# Patient Record
Sex: Female | Born: 1985 | Race: Black or African American | Hispanic: No | Marital: Single | Smoking: Current some day smoker
Health system: Southern US, Community
[De-identification: ages and names within clinical notes are randomized; demographics above are authoritative.]

## PROBLEM LIST (undated history)

## (undated) DIAGNOSIS — F419 Anxiety disorder, unspecified: Secondary | ICD-10-CM

## (undated) DIAGNOSIS — G510 Bell's palsy: Secondary | ICD-10-CM

## (undated) DIAGNOSIS — T8859XA Other complications of anesthesia, initial encounter: Secondary | ICD-10-CM

## (undated) DIAGNOSIS — R51 Headache: Secondary | ICD-10-CM

## (undated) DIAGNOSIS — R519 Headache, unspecified: Secondary | ICD-10-CM

## (undated) DIAGNOSIS — T4145XA Adverse effect of unspecified anesthetic, initial encounter: Secondary | ICD-10-CM

## (undated) DIAGNOSIS — J45909 Unspecified asthma, uncomplicated: Secondary | ICD-10-CM

## (undated) HISTORY — PX: INDUCED ABORTION: SHX677

## (undated) SURGERY — Surgical Case
Anesthesia: *Unknown

---

## 2008-05-09 ENCOUNTER — Emergency Department: Payer: Self-pay | Admitting: Emergency Medicine

## 2008-05-18 DIAGNOSIS — E669 Obesity, unspecified: Secondary | ICD-10-CM | POA: Insufficient documentation

## 2008-10-02 ENCOUNTER — Emergency Department: Payer: Self-pay | Admitting: Emergency Medicine

## 2009-02-09 ENCOUNTER — Inpatient Hospital Stay: Payer: Self-pay

## 2011-11-04 ENCOUNTER — Emergency Department: Payer: Self-pay | Admitting: Emergency Medicine

## 2011-12-09 ENCOUNTER — Emergency Department: Payer: Self-pay | Admitting: Emergency Medicine

## 2011-12-09 LAB — PREGNANCY, URINE: Pregnancy Test, Urine: NEGATIVE m[IU]/mL
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
## 2016-04-15 ENCOUNTER — Emergency Department
Admission: EM | Admit: 2016-04-15 | Discharge: 2016-04-15 | Disposition: A | Discharge status: Home / Self Care | Payer: Self-pay | Attending: Emergency Medicine | Admitting: Emergency Medicine

## 2016-04-15 DIAGNOSIS — H1033 Unspecified acute conjunctivitis, bilateral: Secondary | ICD-10-CM | POA: Insufficient documentation

## 2016-04-15 DIAGNOSIS — H10023 Other mucopurulent conjunctivitis, bilateral: Secondary | ICD-10-CM

## 2016-04-15 MED ORDER — TOBRAMYCIN 0.3 % OP SOLN: 1.0000 [drp] | Freq: Three times a day (TID) | OPHTHALMIC | 0 refills | Status: AC

## 2016-04-15 NOTE — ED Triage Notes (Signed)
Pt reports pink eye, drainage in both eyes

## 2016-04-15 NOTE — ED Provider Notes (Signed)
[facility]-EMERGENCY DEPARTMENT Provider Note   CSN: [id number] Arrival date & time: 04/15/16  1256     History   Chief Complaint Chief Complaint  Patient presents with  . Conjunctivitis    HPI Miranda Hoffman is a 30 y.o. female.  30 year old female presents today complaining of bilateral eye irritation and swelling for the past 2-3 days. Pt concerned she may have pink eye, no known sick contacts. Mild drainage from the eyes. Has not had any vision changes or eye pain. Does not wear contact lenses or glasses    The history is provided by the patient.  Conjunctivitis  This is a new problem. The current episode started 2 days ago. The problem occurs constantly. The problem has not changed since onset.Nothing aggravates the symptoms. Nothing relieves the symptoms. She has tried nothing for the symptoms.    History reviewed. No pertinent past medical history.  There are no active problems to display for this patient.   History reviewed. No pertinent surgical history.  OB History    No data available       Home Medications    Prior to Admission medications   Medication Sig Start Date End Date Taking? Authorizing Provider  tobramycin (TOBREX) 0.3 % ophthalmic solution Place 1 drop into the right eye 3 (three) times daily. 04/15/16 04/25/16  Christella ScheuermannEmma V Eleazar Kimmey, PA-C    Family History No family history on file.  Social History Social History  Substance Use Topics  . Smoking status: Never Smoker  . Smokeless tobacco: Never Used  . Alcohol use Yes     Allergies   Review of patient's allergies indicates no known allergies.   Review of Systems Review of Systems  Constitutional: Negative for chills and fever.  HENT: Negative for rhinorrhea.   Eyes: Positive for discharge, redness and itching. Negative for photophobia, pain and visual disturbance.  All other systems reviewed and are negative.    Physical Exam Updated Vital Signs BP 126/72   Pulse 77   Temp 98.2  F (36.8 C)   Resp 16   Ht 5\' 7"  (1.702 m)   Wt 103 kg   LMP 03/24/2016   SpO2 100%   BMI 35.55 kg/m   Physical Exam  Constitutional: She appears well-developed and well-nourished.  HENT:  Head: Normocephalic and atraumatic.  Right Ear: External ear normal.  Left Ear: External ear normal.  Nose: Nose normal.  Mouth/Throat: Oropharynx is clear and moist.  Eyes: EOM are normal. Pupils are equal, round, and reactive to light. Right eye exhibits discharge. Left eye exhibits discharge.  Neck: Normal range of motion. Neck supple.  Lymphadenopathy:    She has no cervical adenopathy.  Skin: Skin is warm and dry.  Nursing note and vitals reviewed.    ED Treatments / Results  Labs (all labs ordered are listed, but only abnormal results are displayed) Labs Reviewed - No data to display  EKG  EKG Interpretation None       Radiology No results found.  Procedures Procedures (including critical care time)  Medications Ordered in ED Medications - No data to display   Initial Impression / Assessment and Plan / ED Course  I have reviewed the triage vital signs and the nursing notes.  Pertinent labs & imaging results that were available during my care of the patient were reviewed by me and considered in my medical decision making (see chart for details).  Clinical Course      Final Clinical Impressions(s) /  ED Diagnoses   Final diagnoses:  Acute bacterial conjunctivitis, bilateral    New Prescriptions Discharge Medication List as of 04/15/2016  2:06 PM    START taking these medications   Details  tobramycin (TOBREX) 0.3 % ophthalmic solution Place 1 drop into the right eye 3 (three) times daily., Starting Tue 04/15/2016, Until Fri 04/25/2016, Print         Christella ScheuermannEmma V Eliese Kerwood, PA-C 04/15/16 1423    Emily FilbertJonathan E Williams, MD 04/15/16 [phone]

## 2016-06-19 ENCOUNTER — Encounter: Payer: Self-pay | Admitting: Emergency Medicine

## 2016-06-19 ENCOUNTER — Emergency Department
Admission: EM | Admit: 2016-06-19 | Discharge: 2016-06-19 | Disposition: A | Discharge status: Home / Self Care | Payer: No Typology Code available for payment source | Attending: Emergency Medicine | Admitting: Emergency Medicine

## 2016-06-19 ENCOUNTER — Emergency Department: Payer: No Typology Code available for payment source

## 2016-06-19 DIAGNOSIS — Y92410 Unspecified street and highway as the place of occurrence of the external cause: Secondary | ICD-10-CM | POA: Insufficient documentation

## 2016-06-19 DIAGNOSIS — Y9389 Activity, other specified: Secondary | ICD-10-CM | POA: Insufficient documentation

## 2016-06-19 DIAGNOSIS — Y999 Unspecified external cause status: Secondary | ICD-10-CM | POA: Insufficient documentation

## 2016-06-19 DIAGNOSIS — S80211A Abrasion, right knee, initial encounter: Secondary | ICD-10-CM

## 2016-06-19 DIAGNOSIS — S8391XA Sprain of unspecified site of right knee, initial encounter: Secondary | ICD-10-CM

## 2016-06-19 DIAGNOSIS — S8991XA Unspecified injury of right lower leg, initial encounter: Secondary | ICD-10-CM | POA: Diagnosis present

## 2016-06-19 MED ORDER — NAPROXEN 500 MG PO TBEC: 500.0000 mg | DELAYED_RELEASE_TABLET | Freq: Two times a day (BID) | ORAL | 0 refills | Status: DC
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
## 2016-06-19 NOTE — Discharge Instructions (Signed)
Keep the ace bandage in place for support as needed. Apply ice to reduce swelling. Take the Naproxen as needed for pain relief. Follow-up with Dr. Ernest PineHooten for continued symptoms.
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
## 2016-08-05 ENCOUNTER — Encounter: Payer: Self-pay | Admitting: Emergency Medicine

## 2016-08-05 ENCOUNTER — Emergency Department
Admission: EM | Admit: 2016-08-05 | Discharge: 2016-08-05 | Disposition: A | Discharge status: Home / Self Care | Payer: Medicaid Other | Attending: Emergency Medicine | Admitting: Emergency Medicine

## 2016-08-05 DIAGNOSIS — F321 Major depressive disorder, single episode, moderate: Secondary | ICD-10-CM

## 2016-08-05 DIAGNOSIS — F320 Major depressive disorder, single episode, mild: Secondary | ICD-10-CM | POA: Insufficient documentation

## 2016-08-05 DIAGNOSIS — Z79899 Other long term (current) drug therapy: Secondary | ICD-10-CM | POA: Insufficient documentation

## 2016-08-05 HISTORY — DX: Bell's palsy: G51.0

## 2016-08-05 LAB — COMPREHENSIVE METABOLIC PANEL
ALT: 15 U/L (ref 14–54)
ANION GAP: 8 (ref 5–15)
AST: 23 U/L (ref 15–41)
Albumin: 3.9 g/dL (ref 3.5–5.0)
Alkaline Phosphatase: 81 U/L (ref 38–126)
BUN: 11 mg/dL (ref 6–20)
CHLORIDE: 105 mmol/L (ref 101–111)
CO2: 23 mmol/L (ref 22–32)
CREATININE: 0.71 mg/dL (ref 0.44–1.00)
Calcium: 9.4 mg/dL (ref 8.9–10.3)
Glucose, Bld: 91 mg/dL (ref 65–99)
Potassium: 3.7 mmol/L (ref 3.5–5.1)
Sodium: 136 mmol/L (ref 135–145)
Total Bilirubin: 0.7 mg/dL (ref 0.3–1.2)
Total Protein: 7.9 g/dL (ref 6.5–8.1)

## 2016-08-05 LAB — CBC
HCT: 37.2 % (ref 35.0–47.0)
Hemoglobin: 12.4 g/dL (ref 12.0–16.0)
MCH: 28.1 pg (ref 26.0–34.0)
MCHC: 33.4 g/dL (ref 32.0–36.0)
MCV: 84.2 fL (ref 80.0–100.0)
PLATELETS: 320 10*3/uL (ref 150–440)
RBC: 4.42 MIL/uL (ref 3.80–5.20)
RDW: 13.7 % (ref 11.5–14.5)
WBC: 4.1 10*3/uL (ref 3.6–11.0)

## 2016-08-05 LAB — URINE DRUG SCREEN, QUALITATIVE (ARMC ONLY)
Amphetamines, Ur Screen: NOT DETECTED
BARBITURATES, UR SCREEN: NOT DETECTED
Benzodiazepine, Ur Scrn: NOT DETECTED
CANNABINOID 50 NG, UR ~~LOC~~: NOT DETECTED
Cocaine Metabolite,Ur ~~LOC~~: NOT DETECTED
MDMA (ECSTASY) UR SCREEN: NOT DETECTED
Methadone Scn, Ur: NOT DETECTED
Opiate, Ur Screen: NOT DETECTED
Phencyclidine (PCP) Ur S: NOT DETECTED
TRICYCLIC, UR SCREEN: NOT DETECTED

## 2016-08-05 LAB — SALICYLATE LEVEL

## 2016-08-05 LAB — ACETAMINOPHEN LEVEL: Acetaminophen (Tylenol), Serum: <10 ug/mL — ABNORMAL LOW (ref 10–30)

## 2016-08-05 LAB — ETHANOL

## 2016-08-05 LAB — POCT PREGNANCY, URINE: PREG TEST UR: NEGATIVE

## 2016-08-05 MED ORDER — FLUOXETINE HCL 20 MG PO CAPS: 20.0000 mg | ORAL_CAPSULE | Freq: Every day | ORAL | 1 refills | Status: DC

## 2016-08-05 NOTE — ED Notes (Signed)
BEHAVIORAL HEALTH ROUNDING Patient sleeping: No. Patient alert and oriented: yes Behavior appropriate: Yes.  ; If no, describe:  Nutrition and fluids offered: yes Toileting and hygiene offered: Yes  Sitter present: q15 minute observations and security  monitoring Law enforcement present: Yes  ODS   She has a visitor - her mother - observed by pt relations Alfredia ClientMary Jo

## 2016-08-05 NOTE — ED Notes (Signed)
BEHAVIORAL HEALTH ROUNDING Patient sleeping: No. Patient alert and oriented: yes Behavior appropriate: Yes.  ; If no, describe:  Nutrition and fluids offered: yes Toileting and hygiene offered: Yes  Sitter present: q15 minute observations and security  monitoring Law enforcement present: Yes  ODS  She is upset that we do not have a toilet seat on the commode in the quad BR - she is insisting on going to the BR down the hallway  Pt informed that we do not have the staff to escort her to the BR every time when a BR for her use is steps from her bed  Pt educated about safety and why we have procedures in place
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
## 2016-08-05 NOTE — ED Provider Notes (Signed)
[facility] Emergency Department Provider Note   ____________________________________________   First MD Initiated Contact with Patient 08/05/16 0901     (approximate)  I have reviewed the triage vital signs and the nursing notes.   HISTORY  Chief Complaint Depression    HPI Margaretha SeedsJuakisha T Bjorkman is a 30 y.o. female the patient having multiple stressors recently which are documented in the nurse's notes. Patient feels very depressed. Patient does not have anyone to talk to. Patient was thinking of suicide but has noted says she would never do it.   Past Medical History:  Diagnosis Date  . Bell's palsy     There are no active problems to display for this patient.   Past Surgical History:  Procedure Laterality Date  . INDUCED ABORTION      Prior to Admission medications   Medication Sig Start Date End Date Taking? Authorizing Provider  naproxen (EC NAPROSYN) 500 MG EC tablet Take 1 tablet (500 mg total) by mouth 2 (two) times daily with a meal. 06/19/16   Jenise V Bacon Menshew, PA-C    Allergies Patient has no known allergies.  No family history on file.  Social History Social History  Substance Use Topics  . Smoking status: Never Smoker  . Smokeless tobacco: Never Used  . Alcohol use Yes    Review of Systems Constitutional: No fever/chills Eyes: No visual changes. ENT: No sore throat. Cardiovascular: Denies chest pain. Respiratory: Denies shortness of breath. Gastrointestinal: No abdominal pain.  No nausea, no vomiting.  No diarrhea.  No constipation. Genitourinary: Negative for dysuria. Musculoskeletal: Negative for back pain. Skin: Negative for rash. Neurological: Negative for headaches, focal weakness or numbness.  10-point ROS otherwise negative.  ____________________________________________   PHYSICAL EXAM:  VITAL SIGNS: ED Triage Vitals  Enc Vitals Group     BP 08/05/16 0839 140/84     Pulse Rate 08/05/16 0839  71     Resp 08/05/16 0839 18     Temp 08/05/16 0839 98.5 F (36.9 C)     Temp Source 08/05/16 0839 Oral     SpO2 08/05/16 0839 97 %     Weight 08/05/16 0840 220 lb (99.8 kg)     Height 08/05/16 0840 5\' 7"  (1.702 m)     Head Circumference --      Peak Flow --      Pain Score 08/05/16 0842 5     Pain Loc --      Pain Edu? --      Excl. in GC? --     Constitutional: Alert and oriented. Well appearing and in no acute distress. Eyes: Conjunctivae are normal. PERRL. EOMI. Head: Atraumatic. Nose: No congestion/rhinnorhea. Mouth/Throat: Mucous membranes are moist.  Oropharynx non-erythematous. Neck: No stridor Cardiovascular: Normal rate, regular rhythm. Grossly normal heart sounds.  Good peripheral circulation. Respiratory: Normal respiratory effort.  No retractions. Lungs CTAB. Gastrointestinal: Soft and nontender. No distention. No abdominal bruits. No CVA tenderness. Musculoskeletal: No lower extremity tenderness nor edema.  No joint effusions.   ____________________________________________   LABS (all labs ordered are listed, but only abnormal results are displayed)  Labs Reviewed  ACETAMINOPHEN LEVEL - Abnormal; Notable for the following:       Result Value   Acetaminophen (Tylenol), Serum <10 (*)    All other components within normal limits  COMPREHENSIVE METABOLIC PANEL  ETHANOL  SALICYLATE LEVEL  CBC  URINE DRUG SCREEN, QUALITATIVE ([facility] ONLY)  POC URINE PREG, ED  POCT PREGNANCY, URINE  ____________________________________________  EKG   ____________________________________________  RADIOLOGY   ____________________________________________   PROCEDURES  Procedure(s) performed:  Procedures  Critical Care performed:   ____________________________________________   INITIAL IMPRESSION / ASSESSMENT AND PLAN / ED COURSE  Pertinent labs & imaging results that were available during my care of the patient were reviewed by me and considered in my medical  decision making (see chart for details).       Clinical Course      ____________________________________________   FINAL CLINICAL IMPRESSION(S) / ED DIAGNOSES  Final diagnoses:  Mild single current episode of major depressive disorder (HCC)      NEW MEDICATIONS STARTED DURING THIS VISIT:  New Prescriptions   No medications on file     Note:  This document was prepared using Dragon voice recognition software and may include unintentional dictation errors.    Arnaldo NatalPaul F Brennyn Ortlieb, MD 08/05/16 [phone]

## 2016-08-05 NOTE — ED Notes (Signed)
MD Clapacs consulting at this time   

## 2016-08-05 NOTE — Consult Note (Signed)
[facility] Consult   Reason for Consult:  Consult for 30 year old woman who presents voluntarily with symptoms of depression Referring Physician:  Cinda Quest Patient Identification: Miranda Hoffman MRN:  [id number] Principal Diagnosis: Depression, major, single episode, moderate ([place]) Diagnosis:   Patient Active Problem List   Diagnosis Date Noted  . Depression, major, single episode, moderate ([place]) [F32.1] 08/05/2016    Total Time spent with patient: 1 hour  Subjective:   Miranda Hoffman is a 30 y.o. female patient admitted with "stress has just been building up and I don't have much of an outlet".  HPI:  Patient interviewed. Chart reviewed. Labs reviewed. 30 year old woman presented voluntarily at the request of her mother. Patient reports she's been having problems with depression for a couple months. Her mood has been persistently down. Energy level has been very low. She rarely gets up out of bed. Not taking care of basic things around the house. Patient lost her job a month or so ago and then lost her vehicle. Not currently working. Major financial problems. Tiredness a lot during the day. Sleeps poorly at night with frequent awakening. Appetite poor. Has had no active suicidal thoughts or wish to die. Does not have any psychotic symptoms. Today she said that she kind of was at her wits end and had a little bit of a freak out. She was throwing some objects around her own bedroom. Didn't hurt anyone. No sign that there was any acute dangerousness really. She is not receiving any outpatient psychiatric treatment.  Medical history: Patient has a Bell's palsy that apparently happened during her last pregnancy. It's on the right side and is very minor. No other medical problems.  Social history: She is a single mother of twin young children. She has her mother living with her as well. She feels like other extended family members are not supportive.  Substance abuse  history: Says she drinks very rarely. No alcohol in the system. Denies any drug abuse current or past.  Past Psychiatric History: Patient has had some bad mood problems in the past but has never been in a psychiatric hospital never been on any psychiatric medicine. No other mental health or psychiatric treatment. No history of suicide attempts.  Risk to Self: Is patient at risk for suicide?: No Risk to Others:   Prior Inpatient Therapy:   Prior Outpatient Therapy:    Past Medical History:  Past Medical History:  Diagnosis Date  . Bell's palsy     Past Surgical History:  Procedure Laterality Date  . INDUCED ABORTION     Family History: No family history on file. Family Psychiatric  History: She thinks she had an aunt who had depression Social History:  History  Alcohol Use  . Yes     History  Drug use: Unknown    Social History   Social History  . Marital status: Single    Spouse name: N/A  . Number of children: N/A  . Years of education: N/A   Social History Main Topics  . Smoking status: Never Smoker  . Smokeless tobacco: Never Used  . Alcohol use Yes  . Drug use: Unknown  . Sexual activity: Not Asked   Other Topics Concern  . None   Social History Narrative  . None   Additional Social History:    Allergies:  No Known Allergies  Labs:  Results for orders placed or performed during the hospital encounter of 08/05/16 (from the past 48 hour(s))  Urine Drug  Screen, Qualitative     Status: None   Collection Time: 08/05/16  8:19 AM  Result Value Ref Range   Tricyclic, Ur Screen NONE DETECTED NONE DETECTED   Amphetamines, Ur Screen NONE DETECTED NONE DETECTED   MDMA (Ecstasy)Ur Screen NONE DETECTED NONE DETECTED   Cocaine Metabolite,Ur [place] NONE DETECTED NONE DETECTED   Opiate, Ur Screen NONE DETECTED NONE DETECTED   Phencyclidine (PCP) Ur S NONE DETECTED NONE DETECTED   Cannabinoid 50 Ng, Ur [place] NONE DETECTED NONE DETECTED   Barbiturates, Ur Screen NONE  DETECTED NONE DETECTED   Benzodiazepine, Ur Scrn NONE DETECTED NONE DETECTED   Methadone Scn, Ur NONE DETECTED NONE DETECTED    Comment: (NOTE) 270  Tricyclics, urine               Cutoff 1000 ng/mL 200  Amphetamines, urine             Cutoff 1000 ng/mL 300  MDMA (Ecstasy), urine           Cutoff 500 ng/mL 400  Cocaine Metabolite, urine       Cutoff 300 ng/mL 500  Opiate, urine                   Cutoff 300 ng/mL 600  Phencyclidine (PCP), urine      Cutoff 25 ng/mL 700  Cannabinoid, urine              Cutoff 50 ng/mL 800  Barbiturates, urine             Cutoff 200 ng/mL 900  Benzodiazepine, urine           Cutoff 200 ng/mL 1000 Methadone, urine                Cutoff 300 ng/mL 1100 1200 The urine drug screen provides only a preliminary, unconfirmed 1300 analytical test result and should not be used for non-medical 1400 purposes. Clinical consideration and professional judgment should 1500 be applied to any positive drug screen result due to possible 1600 interfering substances. A more specific alternate chemical method 1700 must be used in order to obtain a confirmed analytical result.  1800 Gas chromato graphy / mass spectrometry (GC/MS) is the preferred 1900 confirmatory method.   Comprehensive metabolic panel     Status: None   Collection Time: 08/05/16  8:47 AM  Result Value Ref Range   Sodium 136 135 - 145 mmol/L   Potassium 3.7 3.5 - 5.1 mmol/L   Chloride 105 101 - 111 mmol/L   CO2 23 22 - 32 mmol/L   Glucose, Bld 91 65 - 99 mg/dL   BUN 11 6 - 20 mg/dL   Creatinine, Ser 0.71 0.44 - 1.00 mg/dL   Calcium 9.4 8.9 - 10.3 mg/dL   Total Protein 7.9 6.5 - 8.1 g/dL   Albumin 3.9 3.5 - 5.0 g/dL   AST 23 15 - 41 U/L   ALT 15 14 - 54 U/L   Alkaline Phosphatase 81 38 - 126 U/L   Total Bilirubin 0.7 0.3 - 1.2 mg/dL   GFR calc non Af Amer >60 >60 mL/min   GFR calc Af Amer >60 >60 mL/min    Comment: (NOTE) The eGFR has been calculated using the CKD EPI equation. This calculation  has not been validated in all clinical situations. eGFR's persistently <60 mL/min signify possible Chronic Kidney Disease.    Anion gap 8 5 - 15  Ethanol     Status: None   Collection  Time: 08/05/16  8:47 AM  Result Value Ref Range   Alcohol, Ethyl (B) <5 <5 mg/dL    Comment:        LOWEST DETECTABLE LIMIT FOR SERUM ALCOHOL IS 5 mg/dL FOR MEDICAL PURPOSES ONLY   Salicylate level     Status: None   Collection Time: 08/05/16  8:47 AM  Result Value Ref Range   Salicylate Lvl <1.7 2.8 - 30.0 mg/dL  Acetaminophen level     Status: Abnormal   Collection Time: 08/05/16  8:47 AM  Result Value Ref Range   Acetaminophen (Tylenol), Serum <10 (L) 10 - 30 ug/mL    Comment:        THERAPEUTIC CONCENTRATIONS VARY SIGNIFICANTLY. A RANGE OF 10-30 ug/mL MAY BE AN EFFECTIVE CONCENTRATION FOR MANY PATIENTS. HOWEVER, SOME ARE BEST TREATED AT CONCENTRATIONS OUTSIDE THIS RANGE. ACETAMINOPHEN CONCENTRATIONS >150 ug/mL AT 4 HOURS AFTER INGESTION AND >50 ug/mL AT 12 HOURS AFTER INGESTION ARE OFTEN ASSOCIATED WITH TOXIC REACTIONS.   cbc     Status: None   Collection Time: 08/05/16  8:47 AM  Result Value Ref Range   WBC 4.1 3.6 - 11.0 K/uL   RBC 4.42 3.80 - 5.20 MIL/uL   Hemoglobin 12.4 12.0 - 16.0 g/dL   HCT 37.2 35.0 - 47.0 %   MCV 84.2 80.0 - 100.0 fL   MCH 28.1 26.0 - 34.0 pg   MCHC 33.4 32.0 - 36.0 g/dL   RDW 13.7 11.5 - 14.5 %   Platelets 320 150 - 440 K/uL  Pregnancy, urine POC     Status: None   Collection Time: 08/05/16  9:26 AM  Result Value Ref Range   Preg Test, Ur NEGATIVE NEGATIVE    Comment:        THE SENSITIVITY OF THIS METHODOLOGY IS >24 mIU/mL     No current facility-administered medications for this encounter.    Current Outpatient Prescriptions  Medication Sig Dispense Refill  . FLUoxetine (PROZAC) 20 MG capsule Take 1 capsule (20 mg total) by mouth daily. 30 capsule 1  . naproxen (EC NAPROSYN) 500 MG EC tablet Take 1 tablet (500 mg total) by mouth 2 (two)  times daily with a meal. 30 tablet 0    Musculoskeletal: Strength & Muscle Tone: within normal limits Gait & Station: normal Patient leans: N/A  Psychiatric Specialty Exam: Physical Exam  Nursing note and vitals reviewed. Constitutional: She appears well-developed and well-nourished.  HENT:  Head: Normocephalic and atraumatic.  Eyes: Conjunctivae are normal. Pupils are equal, round, and reactive to light.  Neck: Normal range of motion.  Cardiovascular: Regular rhythm and normal heart sounds.   Respiratory: Effort normal. No respiratory distress.  GI: Soft.  Musculoskeletal: Normal range of motion.  Neurological: She is alert.  Skin: Skin is warm and dry.  Psychiatric: Judgment normal. Her speech is delayed. She is slowed. Thought content is not paranoid. Cognition and memory are normal. She exhibits a depressed mood. She expresses no homicidal and no suicidal ideation.    Review of Systems  Constitutional: Negative.   HENT: Negative.   Eyes: Negative.   Respiratory: Negative.   Cardiovascular: Negative.   Gastrointestinal: Negative.   Musculoskeletal: Negative.   Skin: Negative.   Neurological: Negative.   Psychiatric/Behavioral: Positive for depression. Negative for hallucinations, memory loss, substance abuse and suicidal ideas. The patient is nervous/anxious and has insomnia.     Blood pressure 116/68, pulse 88, temperature 98.3 F (36.8 C), temperature source Oral, resp. rate 18, height '5\' 7"'$  (  1.702 m), weight 99.8 kg (220 lb), last menstrual period 07/28/2016, SpO2 97 %.Body mass index is 34.46 kg/m.  General Appearance: Casual  Eye Contact:  Good  Speech:  Slow  Volume:  Decreased  Mood:  Depressed  Affect:  Congruent  Thought Process:  Goal Directed  Orientation:  Full (Time, Place, and Person)  Thought Content:  Logical  Suicidal Thoughts:  No  Homicidal Thoughts:  No  Memory:  Immediate;   Good Recent;   Fair Remote;   Fair  Judgement:  Fair  Insight:   Fair  Psychomotor Activity:  Decreased  Concentration:  Concentration: Fair  Recall:  [employer] of Knowledge:  Fair  Language:  Fair  Akathisia:  No  Handed:  Right  AIMS (if indicated):     Assets:  Communication Skills Desire for Improvement Financial Resources/Insurance Housing Physical Health Resilience Social Support  ADL's:  Intact  Cognition:  WNL  Sleep:        Treatment Plan Summary: Medication management and Plan This is a 30 year old woman and give symptoms consistent with major depression. No psychosis. No acute suicidality. No history of suicidal behavior. Patient is interested in treatment and is appropriate in her behavior. No need for psychiatric hospitalization. Psychoeducation about depression provided. I recommend that we start an antidepressant medicine. Side effects and use of antidepressants described. Prescription done for Prozac 20 mg per day with a refill. She will be referred to [facility]. Case reviewed with TTS and emergency room physician. She can be discharged from the emergency room.  Disposition: Patient does not meet criteria for psychiatric inpatient admission. Supportive therapy provided about ongoing stressors.  Alethia Berthold, MD 08/05/2016 3:49 PM

## 2016-08-05 NOTE — ED Notes (Signed)

## 2016-08-05 NOTE — BH Assessment (Signed)
Per request of Psych MD (Dr. Blair Haileylapcs), writer provided the pt. with information and instructions on how to access Outpatient Mental Health & Substance Abuse Treatment ([facility]) .  Patient denies SI/HI and AV/H.

## 2016-08-05 NOTE — BH Assessment (Signed)
Assessment Note  Miranda Hoffman is an 30 y.o. female who presents to the ER due to her family having concerns about her mood. Per the report of the patient, she's been depressed and have started to have thoughts of dying. She further states she have no plans or intents to do anything to harm herself. She also states her family are more concerned due to increased agitation, irritability and easily annoyed.   Current stressors the patient have identified are; being a single parent, her mother is also living with her and she have to help care for her. Patient is currently unemployed and have limited income. "And it's the holidays and I'm just frustrated." The patient denied SI/HI and AV/H with this Clinical research associatewriter, throughout the interview.  Patient denies the use of mind-altering substances. She also denies any involvement with the legal system and with DSS. During the interview, she was calm, cooperative and pleasant.  Diagnosis: Depression  Past Medical History:  Past Medical History:  Diagnosis Date  . Bell's palsy     Past Surgical History:  Procedure Laterality Date  . INDUCED ABORTION      Family History: No family history on file.  Social History:  reports that she has never smoked. She has never used smokeless tobacco. She reports that she drinks alcohol. Her drug history is not on file.  Additional Social History:  Alcohol / Drug Use Pain Medications: See PTA Prescriptions: See PTA Over the Counter: See PTA History of alcohol / drug use?: No history of alcohol / drug abuse Longest period of sobriety (when/how long): Reports of none Negative Consequences of Use:  (n/a) Withdrawal Symptoms:  (Reports of none)  CIWA: CIWA-Ar BP: 116/68 Pulse Rate: 88 COWS:    Allergies: No Known Allergies  Home Medications:  (Not in a hospital admission)  OB/GYN Status:  Patient's last menstrual period was 07/28/2016.  General Assessment Data [facility]  Assessment: In system Is this a Tele or Face-to-Face Assessment?: Face-to-Face Is this an Initial Assessment or a Re-assessment for this encounter?: Initial Assessment Marital status: Single Maiden name: n/a Is patient pregnant?: No Pregnancy Status: No Living Arrangements: Children, Other relatives, Parent Can pt return to current living arrangement?: Yes Admission Status: Voluntary Is patient capable of signing voluntary admission?: Yes Referral Source: Self/Family/Friend Insurance type: Medicaid  Medical Screening Exam [facility]-in ONLY) Medical Exam completed: Yes  Crisis Care Plan Living Arrangements: Children, Other relatives, Parent Legal Guardian: Other: (None) Name of Psychiatrist: Reports of none Name of Therapist: Reports of none  Education Status Is patient currently in school?: No Current Grade: n/a Highest grade of school patient has completed: Some College Name of school: n/a Contact person: n/a  Risk to self with the past 6 months Suicidal Ideation: No-Not Currently/Within Last 6 Months Has patient been a risk to self within the past 6 months prior to admission? : No Suicidal Intent: No-Not Currently/Within Last 6 Months Has patient had any suicidal intent within the past 6 months prior to admission? : No Is patient at risk for suicide?: No Suicidal Plan?: No-Not Currently/Within Last 6 Months Has patient had any suicidal plan within the past 6 months prior to admission? : No Access to Means: No What has been your use of drugs/alcohol within the last 12 months?: Reports of none Previous Attempts/Gestures: No How many times?: 0 Other Self Harm Risks: reports of none Triggers for Past Attempts: None known Intentional Self Injurious Behavior: None Family Suicide History: No  Recent stressful life event(s): Financial Problems, Loss (Comment), Other (Comment), Conflict (Comment) Persecutory voices/beliefs?: No Depression: Yes Depression Symptoms:  Tearfulness, Isolating, Fatigue, Guilt, Loss of interest in usual pleasures, Feeling worthless/self pity Substance abuse history and/or treatment for substance abuse?: No Suicide prevention information given to non-admitted patients: Not applicable  Risk to Others within the past 6 months Homicidal Ideation: No Does patient have any lifetime risk of violence toward others beyond the six months prior to admission? : No Thoughts of Harm to Others: No Current Homicidal Intent: No Current Homicidal Plan: No Access to Homicidal Means: No Identified Victim: Reports of none History of harm to others?: No Assessment of Violence: None Noted Violent Behavior Description: Reports of none Does patient have access to weapons?: No Criminal Charges Pending?: No Does patient have a court date: No Is patient on probation?: No  Psychosis Hallucinations: None noted Delusions: None noted  Mental Status Report Appearance/Hygiene: Unremarkable, In scrubs Eye Contact: Good Motor Activity: Freedom of movement, Unremarkable Speech: Logical/coherent, Unremarkable Level of Consciousness: Alert Mood: Depressed, Sad, Pleasant Affect: Appropriate to circumstance, Depressed, Sad Anxiety Level: Minimal Thought Processes: Coherent, Relevant Judgement: Unimpaired Orientation: Person, Place, Time, Situation, Appropriate for developmental age Obsessive Compulsive Thoughts/Behaviors: Minimal  Cognitive Functioning Concentration: Normal Memory: Recent Intact, Remote Intact IQ: Average Insight: Fair Impulse Control: Fair Appetite: Good Weight Loss: 0 Weight Gain: 15 (Within the last month) Sleep: Decreased (Having trouble falling and staying asleep) Total Hours of Sleep: 5 Vegetative Symptoms: None  ADLScreening [facility] Assessment Services) Patient's cognitive ability adequate to safely complete daily activities?: Yes Patient able to express need for assistance with ADLs?: Yes Independently performs  ADLs?: Yes (appropriate for developmental age)  Prior Inpatient Therapy Prior Inpatient Therapy: No Prior Therapy Dates: Reports of none Prior Therapy Facilty/Provider(s): Reports of none Reason for Treatment: Reports of none  Prior Outpatient Therapy Prior Outpatient Therapy: No Prior Therapy Dates: Reports of none Prior Therapy Facilty/Provider(s): Reports of none Reason for Treatment: Reports of none Does patient have an ACCT team?: No Does patient have Intensive In-House Services?  : No Does patient have [facility] services? : No Does patient have P4CC services?: No  ADL Screening (condition at time of admission) Patient's cognitive ability adequate to safely complete daily activities?: Yes Is the patient deaf or have difficulty hearing?: No Does the patient have difficulty seeing, even when wearing glasses/contacts?: No Does the patient have difficulty concentrating, remembering, or making decisions?: No Patient able to express need for assistance with ADLs?: Yes Does the patient have difficulty dressing or bathing?: No Independently performs ADLs?: Yes (appropriate for developmental age) Does the patient have difficulty walking or climbing stairs?: No Weakness of Legs: None Weakness of Arms/Hands: None  Home Assistive Devices/Equipment Home Assistive Devices/Equipment: None  Therapy Consults (therapy consults require a physician order) PT Evaluation Needed: No OT Evalulation Needed: No SLP Evaluation Needed: No Abuse/Neglect Assessment (Assessment to be complete while patient is alone) Physical Abuse: Denies Verbal Abuse: Denies Sexual Abuse: Denies Exploitation of patient/patient's resources: Denies Self-Neglect: Denies Values / Beliefs Cultural Requests During Hospitalization: None Spiritual Requests During Hospitalization: None Consults Spiritual Care Consult Needed: No Social Work Consult Needed: No Merchant navy officer (For Healthcare) Does Patient Have a  Medical Advance Directive?: No Would patient like information on creating a medical advance directive?: No - Patient declined    Additional Information 1:1 In Past 12 Months?: No CIRT Risk: No Elopement Risk: No Does patient have medical clearance?: Yes  Child/Adolescent Assessment Running Away Risk: Denies (  Reports of none)  Disposition:  Disposition Initial Assessment Completed for this Encounter: Yes Disposition of Patient: Other dispositions (ED Ordered Psych Consult)  On Site Evaluation by:   Reviewed with Physician:    Lilyan Gilfordalvin J. Raysa Bosak MS, LCAS, LPC, NCC, CCSI Therapeutic Triage Specialist 08/05/2016 4:03 PM

## 2016-08-05 NOTE — ED Notes (Signed)
BEHAVIORAL HEALTH ROUNDING Patient sleeping: Yes.   Patient alert and oriented: eyes closed  Appears to be asleep Behavior appropriate: Yes.  ; If no, describe:  Nutrition and fluids offered: Yes  Toileting and hygiene offered: sleeping Sitter present: q 15 minute observations and security monitoring Law enforcement present: yes  ODS 

## 2016-08-05 NOTE — Discharge Instructions (Signed)
Please return for any further problems. Please follow-up with [facility] as instructed by Dr. Delaney Meigslaypacs. Please take the medicines he prescribed feels well.

## 2016-09-04 DIAGNOSIS — Z8759 Personal history of other complications of pregnancy, childbirth and the puerperium: Secondary | ICD-10-CM | POA: Insufficient documentation

## 2016-09-04 HISTORY — DX: Personal history of other complications of pregnancy, childbirth and the puerperium: Z87.59

## 2016-09-04 LAB — HM HIV SCREENING LAB: HM HIV Screening: NEGATIVE

## 2017-08-25 NOTE — L&D Delivery Note (Addendum)
Delivery Note Primary OB: [facility] Delivery Physician: Miranda MajorPaul Yacine Garriga, MD Gestational Age: Full term Antepartum complications: Prior CS for twins Intrapartum complications: Precipitous labor, VBAC  A viable Female was delivered via vertex perentation.  Apgars:9 ,9  Weight: 6-13 lb   Placenta status: spontaneous and Intact.  Cord: 3+ vessels;  with the following complications: none.  Anesthesia:  none Episiotomy:  none Lacerations:  labial Suture Repair: 2.0 vicryl Est. Blood Loss (mL):  200 mL  Mom to postpartum.  Baby to Couplet care / Skin to Skin.  Miranda MajorPaul Miranda Gathright, MD, Merlinda FrederickFACOG [facility], [facility] 04/07/2018  3:12 AM [phone]

## 2018-04-07 ENCOUNTER — Inpatient Hospital Stay
Admission: EM | Admit: 2018-04-07 | Discharge: 2018-04-09 | DRG: 806 | Disposition: A | Discharge status: Home / Self Care | Payer: Medicaid Other | Attending: Obstetrics & Gynecology | Admitting: Obstetrics & Gynecology

## 2018-04-07 ENCOUNTER — Other Ambulatory Visit: Payer: Self-pay

## 2018-04-07 DIAGNOSIS — O34219 Maternal care for unspecified type scar from previous cesarean delivery: Secondary | ICD-10-CM

## 2018-04-07 DIAGNOSIS — F321 Major depressive disorder, single episode, moderate: Secondary | ICD-10-CM | POA: Diagnosis present

## 2018-04-07 DIAGNOSIS — D62 Acute posthemorrhagic anemia: Secondary | ICD-10-CM | POA: Diagnosis not present

## 2018-04-07 DIAGNOSIS — O99344 Other mental disorders complicating childbirth: Secondary | ICD-10-CM | POA: Diagnosis present

## 2018-04-07 DIAGNOSIS — O9081 Anemia of the puerperium: Secondary | ICD-10-CM | POA: Diagnosis not present

## 2018-04-07 DIAGNOSIS — Z880 Allergy status to penicillin: Secondary | ICD-10-CM

## 2018-04-07 DIAGNOSIS — O99214 Obesity complicating childbirth: Secondary | ICD-10-CM | POA: Diagnosis present

## 2018-04-07 DIAGNOSIS — Z3A38 38 weeks gestation of pregnancy: Secondary | ICD-10-CM

## 2018-04-07 DIAGNOSIS — E669 Obesity, unspecified: Secondary | ICD-10-CM | POA: Diagnosis present

## 2018-04-07 LAB — CBC
HEMATOCRIT: 33.5 % — AB (ref 35.0–47.0)
Hemoglobin: 10.8 g/dL — ABNORMAL LOW (ref 12.0–16.0)
MCH: 27.1 pg (ref 26.0–34.0)
MCHC: 32.4 g/dL (ref 32.0–36.0)
MCV: 83.8 fL (ref 80.0–100.0)
PLATELETS: 254 10*3/uL (ref 150–440)
RBC: 4 MIL/uL (ref 3.80–5.20)
RDW: 17.8 % — ABNORMAL HIGH (ref 11.5–14.5)
WBC: 7.1 10*3/uL (ref 3.6–11.0)

## 2018-04-07 LAB — OB RESULTS CONSOLE GC/CHLAMYDIA
CHLAMYDIA, DNA PROBE: NEGATIVE
Gonorrhea: NEGATIVE

## 2018-04-07 LAB — OB RESULTS CONSOLE RPR: RPR: NONREACTIVE

## 2018-04-07 LAB — OB RESULTS CONSOLE ABO/RH: RH TYPE: POSITIVE

## 2018-04-07 LAB — OB RESULTS CONSOLE RUBELLA ANTIBODY, IGM: Rubella: IMMUNE

## 2018-04-07 LAB — TYPE AND SCREEN
ABO/RH(D): O POS
ANTIBODY SCREEN: NEGATIVE

## 2018-04-07 LAB — OB RESULTS CONSOLE HEPATITIS B SURFACE ANTIGEN: HEP B S AG: NEGATIVE

## 2018-04-07 LAB — OB RESULTS CONSOLE GBS: STREP GROUP B AG: NEGATIVE

## 2018-04-07 LAB — OB RESULTS CONSOLE HIV ANTIBODY (ROUTINE TESTING): HIV: NONREACTIVE

## 2018-04-07 LAB — OB RESULTS CONSOLE VARICELLA ZOSTER ANTIBODY, IGG: Varicella: IMMUNE

## 2018-04-07 MED ORDER — OXYTOCIN 40 UNITS IN LACTATED RINGERS INFUSION - SIMPLE MED: 2.5000 [IU]/h | INTRAVENOUS | Status: DC

## 2018-04-07 MED ORDER — OXYCODONE-ACETAMINOPHEN 5-325 MG PO TABS: 2.0000 | ORAL_TABLET | ORAL | Status: DC | PRN

## 2018-04-07 MED ORDER — LIDOCAINE HCL (PF) 1 % IJ SOLN
INTRAMUSCULAR | Status: AC
  Filled 2018-04-07: qty 30

## 2018-04-07 MED ORDER — OXYTOCIN 10 UNIT/ML IJ SOLN
INTRAMUSCULAR | Status: AC
  Filled 2018-04-07: qty 2

## 2018-04-07 MED ORDER — ONDANSETRON HCL 4 MG/2ML IJ SOLN: 4.0000 mg | Freq: Four times a day (QID) | INTRAMUSCULAR | Status: DC | PRN

## 2018-04-07 MED ORDER — LACTATED RINGERS IV SOLN: 500.0000 mL | INTRAVENOUS | Status: DC | PRN

## 2018-04-07 MED ORDER — SIMETHICONE 80 MG PO CHEW: 80.0000 mg | CHEWABLE_TABLET | ORAL | Status: DC | PRN

## 2018-04-07 MED ORDER — IBUPROFEN 600 MG PO TABS
600.0000 mg | ORAL_TABLET | Freq: Four times a day (QID) | ORAL | Status: DC
  Administered 2018-04-07: 600 mg via ORAL
  Filled 2018-04-07: qty 1

## 2018-04-07 MED ORDER — AMMONIA AROMATIC IN INHA
RESPIRATORY_TRACT | Status: AC
  Filled 2018-04-07: qty 10

## 2018-04-07 MED ORDER — COCONUT OIL OIL: 1.0000 "application " | TOPICAL_OIL | Status: DC | PRN

## 2018-04-07 MED ORDER — ONDANSETRON HCL 4 MG PO TABS: 4.0000 mg | ORAL_TABLET | ORAL | Status: DC | PRN

## 2018-04-07 MED ORDER — SODIUM CHLORIDE 0.9% FLUSH
3.0000 mL | Freq: Two times a day (BID) | INTRAVENOUS | Status: DC
  Administered 2018-04-07: 3 mL via INTRAVENOUS

## 2018-04-07 MED ORDER — SENNOSIDES-DOCUSATE SODIUM 8.6-50 MG PO TABS
2.0000 | ORAL_TABLET | ORAL | Status: DC
  Administered 2018-04-08 – 2018-04-09 (×2): 2 via ORAL
  Filled 2018-04-07 (×3): qty 2

## 2018-04-07 MED ORDER — ACETAMINOPHEN 325 MG PO TABS
650.0000 mg | ORAL_TABLET | ORAL | Status: DC | PRN
Start: 2018-04-07 — End: 2018-04-09
  Administered 2018-04-07 (×3): 650 mg via ORAL
  Filled 2018-04-07 (×3): qty 2

## 2018-04-07 MED ORDER — TETANUS-DIPHTH-ACELL PERTUSSIS 5-2.5-18.5 LF-MCG/0.5 IM SUSP
0.5000 mL | Freq: Once | INTRAMUSCULAR | Status: DC
  Filled 2018-04-07: qty 0.5

## 2018-04-07 MED ORDER — ONDANSETRON HCL 4 MG/2ML IJ SOLN: 4.0000 mg | INTRAMUSCULAR | Status: DC | PRN

## 2018-04-07 MED ORDER — BENZOCAINE-MENTHOL 20-0.5 % EX AERO
1.0000 "application " | INHALATION_SPRAY | CUTANEOUS | Status: DC | PRN
  Filled 2018-04-07: qty 56

## 2018-04-07 MED ORDER — DIBUCAINE 1 % RE OINT: 1.0000 "application " | TOPICAL_OINTMENT | RECTAL | Status: DC | PRN

## 2018-04-07 MED ORDER — ACETAMINOPHEN 325 MG PO TABS: 650.0000 mg | ORAL_TABLET | ORAL | Status: DC | PRN

## 2018-04-07 MED ORDER — MISOPROSTOL 200 MCG PO TABS
ORAL_TABLET | ORAL | Status: AC
  Filled 2018-04-07: qty 4

## 2018-04-07 MED ORDER — IBUPROFEN 600 MG PO TABS
600.0000 mg | ORAL_TABLET | Freq: Four times a day (QID) | ORAL | Status: DC
  Administered 2018-04-07 – 2018-04-09 (×9): 600 mg via ORAL
  Filled 2018-04-07 (×9): qty 1

## 2018-04-07 MED ORDER — DIPHENHYDRAMINE HCL 25 MG PO CAPS: 25.0000 mg | ORAL_CAPSULE | Freq: Four times a day (QID) | ORAL | Status: DC | PRN

## 2018-04-07 MED ORDER — OXYTOCIN 40 UNITS IN LACTATED RINGERS INFUSION - SIMPLE MED
INTRAVENOUS | Status: AC
Start: 2018-04-07 — End: 2018-04-07
  Filled 2018-04-07: qty 1000

## 2018-04-07 MED ORDER — SODIUM CHLORIDE 0.9 % IV SOLN: 250.0000 mL | INTRAVENOUS | Status: DC | PRN

## 2018-04-07 MED ORDER — WITCH HAZEL-GLYCERIN EX PADS: 1.0000 "application " | MEDICATED_PAD | CUTANEOUS | Status: DC | PRN

## 2018-04-07 MED ORDER — OXYTOCIN BOLUS FROM INFUSION
500.0000 mL | Freq: Once | INTRAVENOUS | Status: DC
  Administered 2018-04-07: 500 mL via INTRAVENOUS

## 2018-04-07 MED ORDER — SODIUM CHLORIDE 0.9% FLUSH: 3.0000 mL | INTRAVENOUS | Status: DC | PRN

## 2018-04-07 MED ORDER — LACTATED RINGERS IV SOLN: INTRAVENOUS | Status: DC

## 2018-04-07 MED ORDER — OXYCODONE-ACETAMINOPHEN 5-325 MG PO TABS: 1.0000 | ORAL_TABLET | ORAL | Status: DC | PRN

## 2018-04-07 MED ORDER — ZOLPIDEM TARTRATE 5 MG PO TABS: 5.0000 mg | ORAL_TABLET | Freq: Every evening | ORAL | Status: DC | PRN

## 2018-04-07 NOTE — Progress Notes (Signed)
PPD#0 SVD Subjective:  Cheerful and well-appearing. Having some afterpains. Pain control is adequate with medications. Voiding without difficulty. Tolerating a regular diet. Ambulating well.  Objective:   Blood pressure 121/71, pulse 70, temperature 98.6 F (37 C), temperature source Oral, resp. rate 20, height 5\' 7"  (1.702 m), weight 112.5 kg, SpO2 100 %.  General: NAD Pulmonary: no increased work of breathing Abdomen: non-distended, non-tender Uterus:  fundus firm; lochia rubra small Extremities: no edema, no erythema, no tenderness, no signs of DVT  Results for orders placed or performed during the hospital encounter of 04/07/18 (from the past 72 hour(s))  OB RESULT CONSOLE Group B Strep     Status: None   Collection Time: 04/07/18 12:00 AM  Result Value Ref Range   GBS Negative   OB RESULTS CONSOLE GC/Chlamydia     Status: None   Collection Time: 04/07/18 12:00 AM  Result Value Ref Range   Gonorrhea Negative    Chlamydia Negative   OB RESULTS CONSOLE RPR     Status: None   Collection Time: 04/07/18 12:00 AM  Result Value Ref Range   RPR Nonreactive   OB RESULTS CONSOLE HIV antibody     Status: None   Collection Time: 04/07/18 12:00 AM  Result Value Ref Range   HIV Non-reactive   OB RESULTS CONSOLE Rubella Antibody     Status: None   Collection Time: 04/07/18 12:00 AM  Result Value Ref Range   Rubella Immune   OB RESULTS CONSOLE Varicella zoster antibody, IgG     Status: None   Collection Time: 04/07/18 12:00 AM  Result Value Ref Range   Varicella Immune   OB RESULTS CONSOLE Hepatitis B surface antigen     Status: None   Collection Time: 04/07/18 12:00 AM  Result Value Ref Range   Hepatitis B Surface Ag Negative   OB RESULTS CONSOLE ABO/Rh     Status: None   Collection Time: 04/07/18 12:00 AM  Result Value Ref Range   RH Type  Positive    ABO Grouping O   Type and screen [facility]     Status: None   Collection Time: 04/07/18  3:43 AM   Result Value Ref Range   ABO/RH(D) O POS    Antibody Screen NEG    Sample Expiration      04/10/2018 Performed at [facility], [address] [place], [place] [address]   CBC     Status: Abnormal   Collection Time: 04/07/18  3:47 AM  Result Value Ref Range   WBC 7.1 3.6 - 11.0 K/uL   RBC 4.00 3.80 - 5.20 MIL/uL   Hemoglobin 10.8 (L) 12.0 - 16.0 g/dL   HCT 60.433.5 (L) 54.035.0 - 98.147.0 %   MCV 83.8 80.0 - 100.0 fL   MCH 27.1 26.0 - 34.0 pg   MCHC 32.4 32.0 - 36.0 g/dL   RDW 19.117.8 (H) 47.811.5 - 29.514.5 %   Platelets 254 150 - 440 K/uL    Comment: Performed at [facility], [address] [place], [place] [address]    Assessment:   32 y.o. [id number] postpartum day #0 in good condition.  Plan:   1) Acute blood loss anemia - hemodynamically stable and asymptomatic.  2) Blood Type --/--/O POS (08/14 0343) /   3) Rubella Immune (08/14 0000) / Varicella Immune /TDAP status: needs postpartum  4) Formula feeding  5) Contraception: not discussed  6) Disposition: continue postpartum care.  Marcelyn BruinsJacelyn Arisbel Maione, CNM  04/07/2018  2:34 PM

## 2018-04-07 NOTE — Progress Notes (Signed)
0230 Incoming call of EMS pt, pt presents with urge to push, 10 cm on arrival, Dr Malena EdmanBeasly on service call called to [facility]. Pt crowning, Dr Tiburcio PeaHarris in house and called for imminent delivery.

## 2018-04-07 NOTE — H&P (Signed)
Obstetrics Admission History & Physical   No chief complaint on file.   HPI:  32 y.o. [id number] @ 6168w1d (04/20/2018, by Other Basis). Admitted on 04/07/2018:   Patient Active Problem List   Diagnosis Date Noted  . VBAC, delivered 04/07/2018  . Depression, major, single episode, moderate (HCC) 08/05/2016     Presents for Active Labor.   Prenatal care at: at another place. Pregnancy complicated by prior CS for twins 9 years ago.  Pain this evening worsening quuickly.  [facility]; EMS came here as fastest route to care.  No ROM or VB.  No reportee complications this pregnancy other than obesity and depression..  ROS: A review of systems was performed and negative, except as stated in the above HPI. PMHx:  Past Medical History:  Diagnosis Date  . Bell's palsy    PSHx:  Past Surgical History:  Procedure Laterality Date  . INDUCED ABORTION     Medications:  No medications prior to admission.   Allergies: is allergic to penicillins. OBHx:  OB History  Gravida Para Term Preterm AB Living  2 2 1 1   2   SAB TAB Ectopic Multiple Live Births        1 2    # Outcome Date GA Lbr Len/2nd Weight Sex Delivery Anes PTL Lv  2 Term 04/07/18 4368w1d   F Vag-Spont None  LIV  1A Preterm 02/09/09    M CS-LTranv  Y LIV  1B Preterm     F       AVW:UJWJXBJY/NWGNFAOZHYQMFHx:Negative/unremarkable except as detailed in HPI.Marland Kitchen.  No family history of birth defects. Soc Hx: Never smoker, Alcohol: none and Recreational drug use: none  Objective:   Vitals:   04/07/18 0307  BP: 130/63  Pulse: 82  Resp: 20  Temp: 97.7 F (36.5 C)   Constitutional: Well nourished, well developed female in no acute distress.  HEENT: normal Skin: Warm and dry.  Cardiovascular:Regular rate and rhythm.   Extremity: trace to 1+ bilateral pedal edema Respiratory: Clear to auscultation bilateral. Normal respiratory effort Abdomen: moderate Back: no CVAT Neuro: DTRs 2+, Cranial nerves grossly  intact Psych: Alert and Oriented x3. No memory deficits. Normal mood and affect.  MS: normal gait, normal bilateral lower extremity ROM/strength/stability.  Pelvic exam: is not limited by body habitus EGBUS: within normal limits Vagina: within normal limits and with normal mucosa blood in the vault Cervix: C/C/ +3 Uterus: Spontaneous uterine activity  Adnexa: not evaluated  EFM:FHR: 140 bpm, variability: moderate,  accelerations:  Present,  decelerations:  Absent Toco: Frequency: Every 2 minutes  Assessment & Plan:   32 y.o. [id number] @ 5768w1d, Admitted on 04/07/2018:Active labor, Precipitous Delivery, VBAC    Admit for labor and Fetal Wellbeing Reassuring  See delivery note.  Annamarie MajorPaul Sarah Zerby, MD, Merlinda FrederickFACOG [facility], [facility] 04/07/2018  3:17 AM

## 2018-04-07 NOTE — Discharge Instructions (Signed)
Vaginal Delivery, Care After °Refer to this sheet in the next few weeks. These instructions provide you with information about caring for yourself after vaginal delivery. Your health care provider may also give you more specific instructions. Your treatment has been planned according to current medical practices, but problems sometimes occur. Call your health care provider if you have any problems or questions. °What can I expect after the procedure? °After vaginal delivery, it is common to have: °· Some bleeding from your vagina. °· Soreness in your abdomen, your vagina, and the area of skin between your vaginal opening and your anus (perineum). °· Pelvic cramps. °· Fatigue. ° °Follow these instructions at home: °Medicines °· Take over-the-counter and prescription medicines only as told by your health care provider. °· If you were prescribed an antibiotic medicine, take it as told by your health care provider. Do not stop taking the antibiotic until it is finished. °Driving ° °· Do not drive or operate heavy machinery while taking prescription pain medicine. °· Do not drive for 24 hours if you received a sedative. °Lifestyle °· Do not drink alcohol. This is especially important if you are breastfeeding or taking medicine to relieve pain. °· Do not use tobacco products, including cigarettes, chewing tobacco, or e-cigarettes. If you need help quitting, ask your health care provider. °Eating and drinking °· Drink at least 8 eight-ounce glasses of water every day unless you are told not to by your health care provider. If you choose to breastfeed your baby, you may need to drink more water than this. °· Eat high-fiber foods every day. These foods may help prevent or relieve constipation. High-fiber foods include: °? Whole grain cereals and breads. °? Brown rice. °? Beans. °? Fresh fruits and vegetables. °Activity °· Return to your normal activities as told by your health care provider. Ask your health care provider  what activities are safe for you. °· Rest as much as possible. Try to rest or take a nap when your baby is sleeping. °· Do not lift anything that is heavier than your baby or 10 lb (4.5 kg) until your health care provider says that it is safe. °· Talk with your health care provider about when you can engage in sexual activity. This may depend on your: °? Risk of infection. °? Rate of healing. °? Comfort and desire to engage in sexual activity. °Vaginal Care °· If you have an episiotomy or a vaginal tear, check the area every day for signs of infection. Check for: °? More redness, swelling, or pain. °? More fluid or blood. °? Warmth. °? Pus or a bad smell. °· Do not use tampons or douches until your health care provider says this is safe. °· Watch for any blood clots that may pass from your vagina. These may look like clumps of dark red, brown, or black discharge. °General instructions °· Keep your perineum clean and dry as told by your health care provider. °· Wear loose, comfortable clothing. °· Wipe from front to back when you use the toilet. °· Ask your health care provider if you can shower or take a bath. If you had an episiotomy or a perineal tear during labor and delivery, your health care provider may tell you not to take baths for a certain length of time. °· Wear a bra that supports your breasts and fits you well. °· If possible, have someone help you with household activities and help care for your baby for at least a few days after   you leave the hospital. °· Keep all follow-up visits for you and your baby as told by your health care provider. This is important. °Contact a health care provider if: °· You have: °? Vaginal discharge that has a bad smell. °? Difficulty urinating. °? Pain when urinating. °? A sudden increase or decrease in the frequency of your bowel movements. °? More redness, swelling, or pain around your episiotomy or vaginal tear. °? More fluid or blood coming from your episiotomy or  vaginal tear. °? Pus or a bad smell coming from your episiotomy or vaginal tear. °? A fever. °? A rash. °? Little or no interest in activities you used to enjoy. °? Questions about caring for yourself or your baby. °· Your episiotomy or vaginal tear feels warm to the touch. °· Your episiotomy or vaginal tear is separating or does not appear to be healing. °· Your breasts are painful, hard, or turn red. °· You feel unusually sad or worried. °· You feel nauseous or you vomit. °· You pass large blood clots from your vagina. If you pass a blood clot from your vagina, save it to show to your health care provider. Do not flush blood clots down the toilet without having your health care provider look at them. °· You urinate more than usual. °· You are dizzy or light-headed. °· You have not breastfed at all and you have not had a menstrual period for 12 weeks after delivery. °· You have stopped breastfeeding and you have not had a menstrual period for 12 weeks after you stopped breastfeeding. °Get help right away if: °· You have: °? Pain that does not go away or does not get better with medicine. °? Chest pain. °? Difficulty breathing. °? Blurred vision or spots in your vision. °? Thoughts about hurting yourself or your baby. °· You develop pain in your abdomen or in one of your legs. °· You develop a severe headache. °· You faint. °· You bleed from your vagina so much that you fill two sanitary pads in one hour. °This information is not intended to replace advice given to you by your health care provider. Make sure you discuss any questions you have with your health care provider. °Document Released: 08/08/2000 Document Revised: 01/23/2016 Document Reviewed: 08/26/2015 °Elsevier Interactive Patient Education © 2018 [employer] Inc[employer] ° °

## 2018-04-07 NOTE — Discharge Summary (Signed)
OB Discharge Summary     Patient Name: Miranda Hoffman DOB: 15-Jan-1986 MRN: [id number]  Date of admission: 04/07/2018 Delivering MD: Letitia Libraobert Paul Harris, MD  Date of Delivery: 04/07/2018  Date of discharge: 04/09/2018 Admitting diagnosis: Labor. Previous Cesarean section Intrauterine pregnancy: 769w1d     Secondary diagnosis: TOLAC     Discharge diagnosis: [facility]                         Hospital course:  Onset of Labor With Vaginal Delivery     32 y.o. yo [id number] at 699w1d was admitted in Active Labor on 04/07/2018. Patient had an uncomplicated labor course as follows:  Membrane Rupture Time/Date: 2:52 AM ,04/07/2018   Intrapartum Procedures: Episiotomy:                                           Lacerations:  Labial [10]  Patient had a delivery of a Viable female infant on  04/07/2018 at 0254 Information for the patient's newborn:  Miranda Hoffman [[id number]]  Delivery Method: Vag-Spont    Pateint had an uncomplicated postpartum course.  She is ambulating, tolerating a regular diet, passing flatus, and urinating well. Patient is discharged home in stable condition on 04/09/2018                                                                  Post partum procedures:none  Complications: None  Physical exam on 04/09/2018: BP 123/78 (BP Location: Right Arm)   Pulse 74   Temp 98 F (36.7 C) (Oral)   Resp 18   Ht 5\' 7"  (1.702 m)   Wt 112.5 kg   SpO2 99%   Breastfeeding? Unknown   BMI 38.84 kg/m     General: alert, cooperative and no distress Lochia: appropriate Uterine Fundus: firm/ U-1/ ML/ NT  DVT Evaluation: No evidence of DVT seen on physical exam.  Labs: Lab Results  Component Value Date   WBC 7.1 04/08/2018   HGB 8.7 (L) 04/08/2018   HCT 26.4 (L) 04/08/2018   MCV 82.7 04/08/2018   PLT 208 04/08/2018   CMP Latest Ref Rng & Units 08/05/2016  Glucose 65 - 99 mg/dL 91  BUN 6 - 20 mg/dL 11  Creatinine 1.300.44 - 8.651.00 mg/dL 7.840.71   Sodium 696135 - 295145 mmol/L 136  Potassium 3.5 - 5.1 mmol/L 3.7  Chloride 101 - 111 mmol/L 105  CO2 22 - 32 mmol/L 23  Calcium 8.9 - 10.3 mg/dL 9.4  Total Protein 6.5 - 8.1 g/dL 7.9  Total Bilirubin 0.3 - 1.2 mg/dL 0.7  Alkaline Phos 38 - 126 U/L 81  AST 15 - 41 U/L 23  ALT 14 - 54 U/L 15    Discharge instruction: per After Visit Summary.  Medications:  Allergies as of 04/09/2018      Reactions   Penicillins Hives      Medication List    TAKE these medications   ibuprofen 600 MG tablet Commonly known as:  ADVIL,MOTRIN Take 1 tablet (600 mg total) by mouth every 6 (six) hours as needed for mild pain, moderate pain  or cramping.   prenatal multivitamin Tabs tablet Take 1 tablet by mouth daily at 12 noon.       Diet: routine diet  Activity: Advance as tolerated. Pelvic rest for 6 weeks.   Outpatient follow up: [facility]     Postpartum contraception: Depo Provera Rhogam Given postpartum: no Rubella vaccine given postpartum: yes Varicella vaccine given postpartum: no TDaP given antepartum or postpartum: Yes, AP 6/ 21/19  Newborn Data: Live born female Miranda Hoffman Birth Weight:  6#12.6oz APGAR: 519, 9  Newborn Delivery   Birth date/time:  04/07/2018 02:54:00 Delivery type:  Vaginal, Spontaneous      Baby Feeding: Bottle  Disposition:home with mother  SIGNED:  Farrel ConnersColleen Aydyn Testerman, CNM 04/09/2018 11:57 AM

## 2018-04-08 LAB — CBC
HEMATOCRIT: 26.4 % — AB (ref 35.0–47.0)
HEMOGLOBIN: 8.7 g/dL — AB (ref 12.0–16.0)
MCH: 27.3 pg (ref 26.0–34.0)
MCHC: 33.1 g/dL (ref 32.0–36.0)
MCV: 82.7 fL (ref 80.0–100.0)
Platelets: 208 10*3/uL (ref 150–440)
RBC: 3.19 MIL/uL — ABNORMAL LOW (ref 3.80–5.20)
RDW: 17.8 % — AB (ref 11.5–14.5)
WBC: 7.1 10*3/uL (ref 3.6–11.0)

## 2018-04-08 NOTE — Progress Notes (Signed)
Subjective:  No concerns, minimal lochia.    Objective:  Vital signs in last 24 hours: Temp:  [98.1 F (36.7 C)-98.8 F (37.1 C)] 98.1 F (36.7 C) (08/15 0754) Pulse Rate:  [63-79] 72 (08/15 0754) Resp:  [18-20] 20 (08/15 0754) BP: (113-131)/(71-83) 131/83 (08/15 0754) SpO2:  [99 %-100 %] 100 % (08/15 0754)    General: NAD Pulmonary: no increased work of breathing Abdomen: non-distended, non-tender, fundus firm at level of umbilicus Extremities: no edema, no erythema, no tenderness  Results for orders placed or performed during the hospital encounter of 04/07/18 (from the past 72 hour(s))  OB RESULT CONSOLE Group B Strep     Status: None   Collection Time: 04/07/18 12:00 AM  Result Value Ref Range   GBS Negative   OB RESULTS CONSOLE GC/Chlamydia     Status: None   Collection Time: 04/07/18 12:00 AM  Result Value Ref Range   Gonorrhea Negative    Chlamydia Negative   OB RESULTS CONSOLE RPR     Status: None   Collection Time: 04/07/18 12:00 AM  Result Value Ref Range   RPR Nonreactive   OB RESULTS CONSOLE HIV antibody     Status: None   Collection Time: 04/07/18 12:00 AM  Result Value Ref Range   HIV Non-reactive   OB RESULTS CONSOLE Rubella Antibody     Status: None   Collection Time: 04/07/18 12:00 AM  Result Value Ref Range   Rubella Immune   OB RESULTS CONSOLE Varicella zoster antibody, IgG     Status: None   Collection Time: 04/07/18 12:00 AM  Result Value Ref Range   Varicella Immune   OB RESULTS CONSOLE Hepatitis B surface antigen     Status: None   Collection Time: 04/07/18 12:00 AM  Result Value Ref Range   Hepatitis B Surface Ag Negative   OB RESULTS CONSOLE ABO/Rh     Status: None   Collection Time: 04/07/18 12:00 AM  Result Value Ref Range   RH Type  Positive    ABO Grouping O   Type and screen [facility]     Status: None   Collection Time: 04/07/18  3:43 AM  Result Value Ref Range   ABO/RH(D) O POS    Antibody Screen NEG     Sample Expiration      04/10/2018 Performed at [facility], [address] [place], [place] [address]   CBC     Status: Abnormal   Collection Time: 04/07/18  3:47 AM  Result Value Ref Range   WBC 7.1 3.6 - 11.0 K/uL   RBC 4.00 3.80 - 5.20 MIL/uL   Hemoglobin 10.8 (L) 12.0 - 16.0 g/dL   HCT 40.933.5 (L) 81.135.0 - 91.447.0 %   MCV 83.8 80.0 - 100.0 fL   MCH 27.1 26.0 - 34.0 pg   MCHC 32.4 32.0 - 36.0 g/dL   RDW 78.217.8 (H) 95.611.5 - 21.314.5 %   Platelets 254 150 - 440 K/uL    Comment: Performed at [facility], [address] [place], [place] [address]  CBC     Status: Abnormal   Collection Time: 04/08/18  4:18 AM  Result Value Ref Range   WBC 7.1 3.6 - 11.0 K/uL   RBC 3.19 (L) 3.80 - 5.20 MIL/uL   Hemoglobin 8.7 (L) 12.0 - 16.0 g/dL   HCT 84.626.4 (L) 96.235.0 - 95.247.0 %   MCV 82.7 80.0 - 100.0 fL   MCH 27.3 26.0 -  34.0 pg   MCHC 33.1 32.0 - 36.0 g/dL   RDW 16.117.8 (H) 09.611.5 - 04.514.5 %   Platelets 208 150 - 440 K/uL    Comment: Performed at [facility], [address] [place], [place] [address]    Assessment:   32 y.o. [id number] postpartum day # 1 TOLAC  Plan:    1) Acute blood loss anemia - hemodynamically stable and asymptomatic - po ferrous sulfate  2) Blood Type --/--/O POS (08/14 0343) / Rubella Non-Immune / Varicella Immune  3) TDAP status up to date  4) Feeding plan breast  5)  Education given regarding options for contraception, as well as compatibility with breast feeding if applicable.  Patient plans on either depo provera, IUD, or nexplanon.  6) Disposition anticipate discharge PPD2  Vena AustriaAndreas Clifford Coudriet, MD, Merlinda FrederickFACOG [facility], [facility] 04/08/2018, 11:46 AM

## 2018-04-09 ENCOUNTER — Encounter: Payer: Self-pay | Admitting: Certified Nurse Midwife

## 2018-04-09 LAB — RPR: RPR Ser Ql: NONREACTIVE

## 2018-04-09 MED ORDER — MEASLES, MUMPS & RUBELLA VAC ~~LOC~~ INJ
0.5000 mL | INJECTION | Freq: Once | SUBCUTANEOUS | Status: AC
  Administered 2018-04-09: 0.5 mL via SUBCUTANEOUS
  Filled 2018-04-09 (×2): qty 0.5

## 2018-04-09 MED ORDER — IBUPROFEN 600 MG PO TABS: 600.0000 mg | ORAL_TABLET | Freq: Four times a day (QID) | ORAL | 0 refills | Status: DC | PRN

## 2018-04-09 MED ORDER — MEDROXYPROGESTERONE ACETATE 150 MG/ML IM SUSP
150.0000 mg | Freq: Once | INTRAMUSCULAR | Status: AC
  Administered 2018-04-09: 150 mg via INTRAMUSCULAR
  Filled 2018-04-09: qty 1

## 2018-04-09 NOTE — Progress Notes (Signed)
Patient discharged home with infant. Discharge instructions, prescriptions and follow up appointment given to and reviewed with patient. Patient verbalized understanding. Patient showering before she goes home
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
## 2018-06-01 ENCOUNTER — Ambulatory Visit: Payer: Medicaid Other | Admitting: Obstetrics & Gynecology

## 2018-06-07 ENCOUNTER — Ambulatory Visit: Payer: Medicaid Other | Admitting: Obstetrics & Gynecology

## 2018-06-15 ENCOUNTER — Other Ambulatory Visit (HOSPITAL_COMMUNITY)
Admission: RE | Admit: 2018-06-15 | Discharge: 2018-06-15 | Disposition: A | Discharge status: Home / Self Care | Payer: Medicaid Other | Source: Ambulatory Visit | Attending: Obstetrics & Gynecology | Admitting: Obstetrics & Gynecology

## 2018-06-15 ENCOUNTER — Ambulatory Visit (INDEPENDENT_AMBULATORY_CARE_PROVIDER_SITE_OTHER): Payer: Medicaid Other | Admitting: Obstetrics & Gynecology

## 2018-06-15 ENCOUNTER — Encounter: Payer: Self-pay | Admitting: Obstetrics & Gynecology

## 2018-06-15 DIAGNOSIS — Z1389 Encounter for screening for other disorder: Secondary | ICD-10-CM | POA: Diagnosis not present

## 2018-06-15 DIAGNOSIS — R87612 Low grade squamous intraepithelial lesion on cytologic smear of cervix (LGSIL): Secondary | ICD-10-CM | POA: Diagnosis not present

## 2018-06-15 DIAGNOSIS — Z23 Encounter for immunization: Secondary | ICD-10-CM | POA: Diagnosis not present

## 2018-06-15 DIAGNOSIS — Z124 Encounter for screening for malignant neoplasm of cervix: Secondary | ICD-10-CM

## 2018-06-15 MED ORDER — CITALOPRAM HYDROBROMIDE 20 MG PO TABS: 20.0000 mg | ORAL_TABLET | Freq: Every day | ORAL | 12 refills | Status: AC

## 2018-06-15 MED ORDER — MEDROXYPROGESTERONE ACETATE 150 MG/ML IM SUSP: 150.0000 mg | INTRAMUSCULAR | 3 refills | Status: AC

## 2018-06-15 NOTE — Patient Instructions (Signed)
Citalopram tablets What is this medicine? CITALOPRAM (sye TAL oh pram) is a medicine for depression. This medicine may be used for other purposes; ask your health care provider or pharmacist if you have questions. COMMON BRAND NAME(S): Celexa What should I tell my health care provider before I take this medicine? They need to know if you have any of these conditions: -bleeding disorders -bipolar disorder or a family history of bipolar disorder -glaucoma -heart disease -history of irregular heartbeat -kidney disease -liver disease -low levels of magnesium or potassium in the blood -receiving electroconvulsive therapy -seizures -suicidal thoughts, plans, or attempt; a previous suicide attempt by you or a family member -take medicines that treat or prevent blood clots -thyroid disease -an unusual or allergic reaction to citalopram, escitalopram, other medicines, foods, dyes, or preservatives -pregnant or trying to become pregnant -breast-feeding How should I use this medicine? Take this medicine by mouth with a glass of water. Follow the directions on the prescription label. You can take it with or without food. Take your medicine at regular intervals. Do not take your medicine more often than directed. Do not stop taking this medicine suddenly except upon the advice of your doctor. Stopping this medicine too quickly may cause serious side effects or your condition may worsen. A special MedGuide will be given to you by the pharmacist with each prescription and refill. Be sure to read this information carefully each time. Talk to your pediatrician regarding the use of this medicine in children. Special care may be needed. Patients over 31 years old may have a stronger reaction and need a smaller dose. Overdosage: If you think you have taken too much of this medicine contact a poison control center or emergency room at once. NOTE: This medicine is only for you. Do not share this medicine with  others. What if I miss a dose? If you miss a dose, take it as soon as you can. If it is almost time for your next dose, take only that dose. Do not take double or extra doses. What may interact with this medicine? Do not take this medicine with any of the following medications: -certain medicines for fungal infections like fluconazole, itraconazole, ketoconazole, posaconazole, voriconazole -cisapride -dofetilide -dronedarone -escitalopram -linezolid -MAOIs like Carbex, Eldepryl, Marplan, Nardil, and Parnate -methylene blue (injected into a vein) -pimozide -thioridazine -ziprasidone This medicine may also interact with the following medications: -alcohol -amphetamines -aspirin and aspirin-like medicines -carbamazepine -certain medicines for depression, anxiety, or psychotic disturbances -certain medicines for infections like chloroquine, clarithromycin, erythromycin, furazolidone, isoniazid, pentamidine -certain medicines for migraine headaches like almotriptan, eletriptan, frovatriptan, naratriptan, rizatriptan, sumatriptan, zolmitriptan -certain medicines for sleep -certain medicines that treat or prevent blood clots like dalteparin, enoxaparin, warfarin -cimetidine -diuretics -fentanyl -lithium -methadone -metoprolol -NSAIDs, medicines for pain and inflammation, like ibuprofen or naproxen -omeprazole -other medicines that prolong the QT interval (cause an abnormal heart rhythm) -procarbazine -rasagiline -supplements like St. John's wort, kava kava, valerian -tramadol -tryptophan This list may not describe all possible interactions. Give your health care provider a list of all the medicines, herbs, non-prescription drugs, or dietary supplements you use. Also tell them if you smoke, drink alcohol, or use illegal drugs. Some items may interact with your medicine. What should I watch for while using this medicine? Tell your doctor if your symptoms do not get better or if they  get worse. Visit your doctor or health care professional for regular checks on your progress. Because it may take several weeks to see the full   effects of this medicine, it is important to continue your treatment as prescribed by your doctor. Patients and their families should watch out for new or worsening thoughts of suicide or depression. Also watch out for sudden changes in feelings such as feeling anxious, agitated, panicky, irritable, hostile, aggressive, impulsive, severely restless, overly excited and hyperactive, or not being able to sleep. If this happens, especially at the beginning of treatment or after a change in dose, call your health care professional. You may get drowsy or dizzy. Do not drive, use machinery, or do anything that needs mental alertness until you know how this medicine affects you. Do not stand or sit up quickly, especially if you are an older patient. This reduces the risk of dizzy or fainting spells. Alcohol may interfere with the effect of this medicine. Avoid alcoholic drinks. Your mouth may get dry. Chewing sugarless gum or sucking hard candy, and drinking plenty of water will help. Contact your doctor if the problem does not go away or is severe. What side effects may I notice from receiving this medicine? Side effects that you should report to your doctor or health care professional as soon as possible: -allergic reactions like skin rash, itching or hives, swelling of the face, lips, or tongue -anxious -black, tarry stools -breathing problems -changes in vision -chest pain -confusion -elevated mood, decreased need for sleep, racing thoughts, impulsive behavior -eye pain -fast, irregular heartbeat -feeling faint or lightheaded, falls -feeling agitated, angry, or irritable -hallucination, loss of contact with reality -loss of balance or coordination -loss of memory -painful or prolonged erections -restlessness, pacing, inability to keep  still -seizures -stiff muscles -suicidal thoughts or other mood changes -trouble sleeping -unusual bleeding or bruising -unusually weak or tired -vomiting Side effects that usually do not require medical attention (report to your doctor or health care professional if they continue or are bothersome): -change in appetite or weight -change in sex drive or performance -dizziness -headache -increased sweating -indigestion, nausea -tremors This list may not describe all possible side effects. Call your doctor for medical advice about side effects. You may report side effects to FDA at 1-800-FDA-1088. Where should I keep my medicine? Keep out of reach of children. Store at room temperature between 15 and 30 degrees C (59 and 86 degrees F). Throw away any unused medicine after the expiration date. NOTE: This sheet is a summary. It may not cover all possible information. If you have questions about this medicine, talk to your doctor, pharmacist, or health care provider.  2018 Elsevier/Gold Standard (2016-01-14 13:18:52)  

## 2018-06-15 NOTE — Progress Notes (Signed)
  OBSTETRICS POSTPARTUM CLINIC PROGRESS NOTE  Subjective:     Miranda Hoffman is a 32 y.o. [phone] female who presents for a postpartum visit. She is 6 weeks postpartum following a Term pregnancy and delivery by VBAC.  I have fully reviewed the prenatal and intrapartum course. Anesthesia: none.  Postpartum course has been complicated by uncomplicated.  Baby is feeding by Bottle.  Bleeding: patient has  resumed menses.  Bowel function is normal. Bladder function is normal.  Patient is not sexually active. Contraception method desired is Depo-Provera injections.  Postpartum depression screening: positive. Edinburgh 13.  The following portions of the patient's history were reviewed and updated as appropriate: allergies, current medications, past family history, past medical history, past social history, past surgical history and problem list.  Review of Systems Pertinent items are noted in HPI.  Objective:    BP 120/80   Ht 5\' 7"  (1.702 m)   Wt 228 lb (103.4 kg)   BMI 35.71 kg/m   General:  alert and no distress   Breasts:  inspection negative, no nipple discharge or bleeding, no masses or nodularity palpable  Lungs: clear to auscultation bilaterally  Heart:  regular rate and rhythm, S1, S2 normal, no murmur, click, rub or gallop  Abdomen: soft, non-tender; bowel sounds normal; no masses,  no organomegaly.     Vulva:  normal  Vagina: normal vagina, no discharge, exudate, lesion, or erythema  Cervix:  no cervical motion tenderness and no lesions  Corpus: normal size, contour, position, consistency, mobility, non-tender  Adnexa:  normal adnexa and no mass, fullness, tenderness  Rectal Exam: Not performed.          Assessment:  Post Partum Care visit 1. Postpartum care following vaginal delivery Plans to cont Depo for [facility]  2. Screening for cervical cancer - Cytology - PAP  3. PPD, counseled on options for management Therapy Meds.  Will start Celexa.  4. Flu shot  today  Plan:  See orders and Patient Instructions Contraceptive counseling for Depo-Provera Follow up in: 6 weeks or as needed.   Annamarie Major, MD, [facility], [facility] 06/15/2018  10:54 AM

## 2018-06-18 LAB — CYTOLOGY - PAP: HPV (WINDOPATH): DETECTED — AB

## 2018-06-21 NOTE — Progress Notes (Signed)
Pt needs Colpo scheduled here for low grade abnormal PAP.  Thx.

## 2018-06-22 ENCOUNTER — Telehealth: Payer: Self-pay | Admitting: Obstetrics & Gynecology

## 2018-06-22 NOTE — Telephone Encounter (Signed)
-----   Message from Nadara Mustard, MD sent at 06/21/2018  9:51 AM EDT ----- Pt needs Colpo scheduled here for low grade abnormal PAP.  Thx.

## 2018-06-22 NOTE — Telephone Encounter (Signed)
Called and left voice mail for patient to call back to be schedule °

## 2018-07-09 ENCOUNTER — Ambulatory Visit (INDEPENDENT_AMBULATORY_CARE_PROVIDER_SITE_OTHER): Payer: Medicaid Other | Admitting: Obstetrics & Gynecology

## 2018-07-09 ENCOUNTER — Encounter: Payer: Self-pay | Admitting: Obstetrics & Gynecology

## 2018-07-09 ENCOUNTER — Ambulatory Visit (INDEPENDENT_AMBULATORY_CARE_PROVIDER_SITE_OTHER): Payer: Medicaid Other

## 2018-07-09 VITALS — BP 120/80 | Ht 68.0 in | Wt 232.0 lb

## 2018-07-09 DIAGNOSIS — Z3042 Encounter for surveillance of injectable contraceptive: Secondary | ICD-10-CM | POA: Diagnosis not present

## 2018-07-09 DIAGNOSIS — R87612 Low grade squamous intraepithelial lesion on cytologic smear of cervix (LGSIL): Secondary | ICD-10-CM | POA: Insufficient documentation

## 2018-07-09 MED ORDER — MEDROXYPROGESTERONE ACETATE 150 MG/ML IM SUSP
150.0000 mg | Freq: Once | INTRAMUSCULAR | Status: AC
  Administered 2018-07-09: 150 mg via INTRAMUSCULAR

## 2018-07-09 NOTE — Progress Notes (Signed)
Pt here for depo which was given IM right glut.  NDC# 67457-887-99 

## 2018-07-09 NOTE — Patient Instructions (Signed)

## 2018-07-09 NOTE — Progress Notes (Signed)
HPI:  Miranda Hoffman is a 32 y.o.  [id number]  who presents today for evaluation and management of abnormal cervical cytology.    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  OB History  Gravida Para Term Preterm AB Living  4 2 1 1 2 3   SAB TAB Ectopic Multiple Live Births    2   1 3     # Outcome Date GA Lbr Len/2nd Weight Sex Delivery Anes PTL Lv  4 Term 04/07/18 [redacted]w[redacted]d  6 lb 12.6 oz (3.08 kg) F Vag-Spont None  LIV  3A Preterm 02/09/09   5 lb 3 oz (2.353 kg) M CS-LTranv  Y LIV  3B Preterm 02/09/09   4 lb 13 oz (2.183 kg) F CS-LTranv   LIV  2 TAB           1 TAB             Past Medical History:  Diagnosis Date  . Bell's palsy     Past Surgical History:  Procedure Laterality Date  . INDUCED ABORTION      SOCIAL HISTORY: Social History   Substance and Sexual Activity  Alcohol Use Yes  . Frequency: Never   Social History   Substance and Sexual Activity  Drug Use Never     History reviewed. No pertinent family history.  ALLERGIES:  Penicillins  Current Outpatient Medications on File Prior to Visit  Medication Sig Dispense Refill  . citalopram (CELEXA) 20 MG tablet Take 1 tablet (20 mg total) by mouth daily. 30 tablet 12  . ibuprofen (ADVIL,MOTRIN) 600 MG tablet Take 1 tablet (600 mg total) by mouth every 6 (six) hours as needed for mild pain, moderate pain or cramping. 30 tablet 0  . medroxyPROGESTERone (DEPO-PROVERA) 150 MG/ML injection Inject 1 mL (150 mg total) into the muscle every 3 (three) months. 1 mL 3  . Prenatal Vit-Fe Fumarate-FA (PRENATAL MULTIVITAMIN) TABS tablet Take 1 tablet by mouth daily at 12 noon.     No current facility-administered medications on file prior to visit.     Physical Exam: -Vitals:  BP 120/80   Ht 5\' 8"  (1.727 m)   Wt 232 lb (105.2 kg)   BMI 35.28 kg/m  GEN: WD, WN, NAD.  A+ O x 3, good mood and affect. ABD:  NT, ND.  Soft, no masses.  No hernias noted.   Pelvic:   Vulva: Normal  appearance.  No lesions.  Vagina: No lesions or abnormalities noted.  Support: Normal pelvic support.  Urethra No masses tenderness or scarring.  Meatus Normal size without lesions or prolapse.  Cervix: See below.  Anus: Normal exam.  No lesions.  Perineum: Normal exam.  No lesions.        Bimanual   Uterus: Normal size.  Non-tender.  Mobile.  AV.  Adnexae: No masses.  Non-tender to palpation.  Cul-de-sac: Negative for abnormality.   PROCEDURE: 1.  Urine Pregnancy Test:  not done 2.  Colposcopy performed with 4% acetic acid after verbal consent obtained                                        -Aceto-white Lesions Location(s): 12, 6 o'clock.              -Biopsy performed at 12, 6 o'clock               -  ECC indicated and performed: Yes.       -Biopsy sites made hemostatic with pressure, AgNO3, and/or Monsel's solution   -Satisfactory colposcopy: Yes.      -Evidence of Invasive cervical CA :  NO  ASSESSMENT:  Miranda Hoffman is a 32 y.o. [id number] here for  1. LGSIL on Pap smear of cervix    PLAN: 1.  I discussed the grading system of pap smears and HPV high risk viral types.  We will discuss and base management after colpo results return. 2. Follow up PAP 6 months, vs intervention if high grade dysplasia identified 3. Treatment of persistantly abnormal PAP smears and cervical dysplasia, even mild, is discussed w pt today in detail, as well as the pros and cons of Cryo and LEEP procedures. Will consider and discuss after results.      Annamarie MajorPaul , MD, [facility] Frederick[facility]ACOG [facility], [facility] 07/09/2018  10:58 AM

## 2018-07-16 ENCOUNTER — Other Ambulatory Visit (HOSPITAL_COMMUNITY)
Admission: RE | Admit: 2018-07-16 | Discharge: 2018-07-16 | Disposition: A | Discharge status: Home / Self Care | Payer: Medicaid Other | Source: Ambulatory Visit | Attending: Obstetrics & Gynecology | Admitting: Obstetrics & Gynecology

## 2018-07-16 DIAGNOSIS — R87612 Low grade squamous intraepithelial lesion on cytologic smear of cervix (LGSIL): Secondary | ICD-10-CM | POA: Diagnosis not present

## 2018-07-16 NOTE — Addendum Note (Signed)
Addended by: Nadara MustardHARRIS, Tilman Mcclaren P on: 07/16/2018 01:38 PM   Modules accepted: Orders

## 2018-07-21 NOTE — Progress Notes (Signed)
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

## 2018-07-29 ENCOUNTER — Ambulatory Visit (INDEPENDENT_AMBULATORY_CARE_PROVIDER_SITE_OTHER): Payer: Medicaid Other | Admitting: Obstetrics & Gynecology

## 2018-07-29 ENCOUNTER — Telehealth: Payer: Self-pay | Admitting: Obstetrics & Gynecology

## 2018-07-29 ENCOUNTER — Encounter: Payer: Self-pay | Admitting: Obstetrics & Gynecology

## 2018-07-29 VITALS — BP 120/80 | Ht 68.0 in | Wt 241.0 lb

## 2018-07-29 DIAGNOSIS — D061 Carcinoma in situ of exocervix: Secondary | ICD-10-CM

## 2018-07-29 NOTE — Telephone Encounter (Signed)
-----   Message from Nadara Mustardobert P Harris, MD sent at 07/29/2018 10:56 AM EST ----- Regarding[facility] Patient Full Name:  Miranda Hoffman  MRN: [id number]  DOB: 1985-10-26  Surgeon: Letitia Libraobert Paul Harris, MD  Requested Surgery Date and Time: Any, not Dec 31 Primary Diagnosis AND Code: CIN 3 Secondary Diagnosis and Code:  Surgical Procedure: LEEP L&D Notification: No Admission Status: same day surgery Length of Surgery: 20 min Special Case Needs: no H&P: yes (date) Phone Interview???: yes Interpreter: Language:  Medical Clearance: no Special Scheduling Instructions: no

## 2018-07-29 NOTE — Telephone Encounter (Signed)
Correction: H&P is on 09/10/18.

## 2018-07-29 NOTE — Telephone Encounter (Signed)
[place]. Please advise

## 2018-07-29 NOTE — Telephone Encounter (Signed)
Lmtrc

## 2018-07-29 NOTE — Patient Instructions (Signed)
Cervical Conization °Cervical conization (cone biopsy) is a procedure in which a cone-shaped portion of the cervix is cut out so that it can be examined under a microscope. The procedure is done to check for cancer cells or cells that might turn into cancer (precancerous cells). You may have this procedure if: °· You have abnormal bleeding from your cervix. °· You had an abnormal Pap test. °· Something abnormal was seen on your cervix during an exam. ° °This procedure is performed in either a health care provider’s office or in an operating room. °Tell a health care provider about: °· Any allergies you have. °· All medicines you are taking, including vitamins, herbs, eye drops, creams, and over-the-counter medicines. °· Any problems you or family members have had with the use of anesthetic medicines. °· Any blood disorders you have. °· Any surgeries you have had. °· Any medical conditions you have. °· Your smoking habits. °· When you normally have your period. °· Whether you are pregnant or may be pregnant. °What are the risks? °Generally, this is a safe procedure. However, problems may occur, including: °· Heavy bleeding for several days or weeks after the procedure. °· Allergic reactions to medicines or dyes. °· Increased risk of preterm labor in future pregnancies. °· Infection (rare). °· Damage to the cervix or other structures or organs (rare). ° °What happens before the procedure? °Staying hydrated °Follow instructions from your health care provider about hydration, which may include: °· Up to 2 hours before the procedure - you may continue to drink clear liquids, such as water, clear fruit juice, black coffee, and plain tea. ° °Eating and drinking restrictions °Follow instructions from your health care provider about eating and drinking, which may include: °· 8 hours before the procedure - stop eating heavy meals or foods such as meat, fried foods, or fatty foods. °· 6 hours before the procedure - stop eating  light meals or foods, such as toast or cereal. °· 6 hours before the procedure - stop drinking milk or drinks that contain milk. °· 2 hours before the procedure - stop drinking clear liquids. ° °General instructions °· Do not douche, have sex, use tampons, or use any vaginal medicines before the procedure as told by your health care provider. °· You may be asked to empty your bladder and bowel right before the procedure. °· Ask your health care provider about: °? Changing or stopping your normal medicines. This is important if you take diabetes medicines or blood thinners. °? Taking medicines such as aspirin and ibuprofen. These medicines can thin your blood. Do not take these medicines before your procedure if your doctor tells you not to. °· Plan to have someone take you home from the hospital or clinic. °What happens during the procedure? °· To reduce your risk of infection: °? Your health care team will wash or sanitize their hands. °? Your skin will be washed with soap. °? Hair may be removed from the surgical area. °· You will undress from the waist down and be given a gown to wear. °· You will lie on an examining table and put your feet in stirrups. °· An IV tube will be inserted into one of your veins. °· You will be given one or more of the following: °? A medicine to help you relax (sedative). °? A medicine to numb the area (local anesthetic). °? A medicine to make you fall asleep (general anesthetic). °? A medicine that numbs the cervix (cervical block). °·   A lubricated device called a speculum will be inserted into your vagina. It will be used to spread open the walls of the vagina so your health care provider can see the inside of the vagina and cervix better. °· An instrument that has a magnifying lens and a light (colposcope) will let your health care provider examine the cervix more closely. °· Your health care provider will apply a solution to your cervix. This turns abnormal areas a pale  color. °· A tissue sample will be removed from the cervix using one of the following methods: °? The cold knife method. In this method, the tissue is cut out with a knife (scalpel). °? The loop electrosurgical excision procedure (LEEP) method. In this method, the tissue is cut out with a thin wire that can burn (cauterize) the tissue with an electrical current. °? Laser treatment method. In this method, the tissue is cut out and then cauterized with a laser beam to prevent bleeding. °· Your health care provider will apply a paste over the biopsy areas to help control bleeding. °· The tissue sample will be examined under a microscope. °The procedure may vary among health care providers and hospitals. °What happens after the procedure? °· Your blood pressure, heart rate, breathing rate, and blood oxygen level will be monitored often until the medicines you were given have worn off. °· If you were given a local anesthetic, you will rest at the clinic or hospital until you are stable and feel ready to go home. °· If you were given a general anesthetic, you may be monitored for a longer period of time. °· You may have some cramping. °· You may have bloody discharge or light to moderate bleeding. °· You may have dark discharge coming from your vagina. This is from the paste used on the cervix to prevent bleeding. °Summary °· Cervical conization is a procedure in which a cone-shaped portion of the cervix is cut out so that it can be examined under a microscope. °· The procedure is done to check for cancer cells or cells that might turn into cancer (precancerous cells). °This information is not intended to replace advice given to you by your health care provider. Make sure you discuss any questions you have with your health care provider. °Document Released: 05/21/2005 Document Revised: 08/13/2016 Document Reviewed: 08/13/2016 °Elsevier Interactive Patient Education © 2017 Elsevier Inc. ° °

## 2018-07-29 NOTE — Progress Notes (Signed)
  HPI:  Patient is a 32 y.o. [id number] presenting for follow up evaluation of abnormal PAP smear in the past.  PAP was LGSIL, and biopsies were as follows: 1. Endocervix, curettage - DYSPLASTIC SQUAMOUS EPITHELIUM - BENIGN ENDOCERVICAL GLANDULAR EPITHELIUM - SEE COMMENT 2. Cervix, biopsy, 6 o'clock - BENIGN ENDOCERVICAL GLANDULAR EPITHELIUM - NO SQUAMOUS EPITHELIUM IDENTIFIED 3. 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 Also,  has a past surgical history that includes Induced abortion., family history is not on file.,  reports that she has never smoked. She has never used smokeless tobacco. She reports that she drinks alcohol. She reports that she does not use drugs.  She has a current medication list which includes the following prescription(s): citalopram, ibuprofen, medroxyprogesterone, and prenatal multivitamin. Also, is allergic to penicillins.  Review of Systems  All other systems reviewed and are negative.   Objective: BP 120/80   Ht '5\' 8"'$  (1.727 m)   Wt 241 lb (109.3 kg)   BMI 36.64 kg/m  Filed Weights   07/29/18 1034  Weight: 241 lb (109.3 kg)   Body mass index is 36.64 kg/m.  Physical examination Physical Exam  Constitutional: She is oriented to person, place, and time. She appears well-developed and well-nourished. No distress.  Musculoskeletal: Normal range of motion.  Neurological: She is alert and oriented to person, place, and time.  Skin: Skin is warm and dry.  Psychiatric: She has a normal mood and affect.  Vitals reviewed.  ASSESSMENT:  History of Cervical Dysplasia Family history of breast cancer  Plan: Pros and cons of LEEP for treatment of CIN 3 as diagnosed by colpo/biopsy discussed and planned.  Cryo and exp mgt options also discussed.   She presents with a significant personal and/or family history of breast cancer  (maternal GM and Aunt). Details of which can be found in her medical/family history. She does not have a previously identified BRCA and Lynch syndrome mutation in her family. Due to her personal and/or family history of cancer she is a candidate for the [facility] test(s).    Risk for cancer, genetic susceptibility discussed.  Patient has undecided gene testing.  Discussed BRCA as well as Lynch syndrome and other cancer risk assessments available based on her family history and personal history. Pros and cons of testing discussed.   A total of 15 minutes were spent face-to-face with the patient during this encounter and over half of that time dealt with counseling and coordination of care.  Barnett Applebaum, MD, [facility], [facility] 07/29/2018  10:41 AM

## 2018-07-29 NOTE — Telephone Encounter (Signed)
Patient is aware of [facility] on 09/07/18 @ 8:40am w/ Dr Tiburcio PeaHarris, Pre-admit Testing phone interview to be scheduled, and OR on 09/16/17. Patient is aware [facility] and [facility]. Patient confirmed Medicaid and no other insurance. Patient stated she will still have Medicaid next year. Ext given.

## 2018-08-11 ENCOUNTER — Telehealth: Payer: Self-pay | Admitting: Obstetrics & Gynecology

## 2018-08-11 NOTE — Telephone Encounter (Signed)
Patient rescheduled surgery to 08/24/18. [facility] is [facility]/18 @ 4:30pm w/ Dr Tiburcio PeaHarris. Pre-admit Testing phone interview to be rescheduled.

## 2018-08-19 ENCOUNTER — Other Ambulatory Visit: Payer: Self-pay | Admitting: Obstetrics & Gynecology

## 2018-08-19 ENCOUNTER — Encounter: Payer: Medicaid Other | Admitting: Obstetrics & Gynecology

## 2018-08-20 ENCOUNTER — Other Ambulatory Visit: Payer: Self-pay

## 2018-08-20 ENCOUNTER — Encounter
Admission: RE | Admit: 2018-08-20 | Discharge: 2018-08-20 | Disposition: A | Discharge status: Home / Self Care | Payer: Medicaid Other | Source: Ambulatory Visit | Attending: Obstetrics & Gynecology | Admitting: Obstetrics & Gynecology

## 2018-08-20 HISTORY — DX: Unspecified asthma, uncomplicated: J45.909

## 2018-08-20 HISTORY — DX: Anxiety disorder, unspecified: F41.9

## 2018-08-20 HISTORY — DX: Adverse effect of unspecified anesthetic, initial encounter: T41.45XA

## 2018-08-20 HISTORY — DX: Headache: R51

## 2018-08-20 HISTORY — DX: Headache, unspecified: R51.9

## 2018-08-20 HISTORY — DX: Other complications of anesthesia, initial encounter: T88.59XA

## 2018-08-20 NOTE — Patient Instructions (Signed)
Your procedure is scheduled on: 08-24-18 TUESDAY Report to Same Day Surgery 2nd floor medical mall [facility](Medical Mall Entrance-take elevator on left to 2nd floor.  Check in with surgery information desk.) To find out your arrival time please call [phone] between 1PM - 3PM on 08-23-18 MONDAY  Remember: Instructions that are not followed completely may result in serious medical risk, up to and including death, or upon the discretion of your surgeon and anesthesiologist your surgery may need to be rescheduled.    _x___ 1. Do not eat food after midnight the night before your procedure. NO GUM OR CANDY AFTER MIDNIGHT.  You may drink clear liquids up to 2 hours before you are scheduled to arrive at the hospital for your procedure.  Do not drink clear liquids within 2 hours of your scheduled arrival to the hospital.  Clear liquids include  --Water or Apple juice without pulp  --Clear carbohydrate beverage such as ClearFast or Gatorade  --Black Coffee or Clear Tea (No milk, no creamers, do not add anything to the coffee or Tea   ____Ensure clear carbohydrate drink on the way to the hospital for bariatric patients  ____Ensure clear carbohydrate drink 3 hours before surgery for Dr Rutherford NailByrnett's patients if physician instructed.    __x__ 2. No Alcohol for 24 hours before or after surgery.   __x__3. No Smoking or e-cigarettes for 24 prior to surgery.  Do not use any chewable tobacco products for at least 6 hour prior to surgery   ____  4. Bring all medications with you on the day of surgery if instructed.    __x__ 5. Notify your doctor if there is any change in your medical condition     (cold, fever, infections).    x___6. On the morning of surgery brush your teeth with toothpaste and water.  You may rinse your mouth with mouth wash if you wish.  Do not swallow any toothpaste or mouthwash.   Do not wear jewelry, make-up, hairpins, clips or nail polish.  Do not wear lotions, powders, or perfumes.  You may wear deodorant.  Do not shave 48 hours prior to surgery. Men may shave face and neck.  Do not bring valuables to the hospital.    [facility] any belongings or valuables.               Contacts, dentures or bridgework may not be worn into surgery.  Leave your suitcase in the car. After surgery it may be brought to your room.  For patients admitted to the hospital, discharge time is determined by your treatment team.  _  Patients discharged the day of surgery will not be allowed to drive home.  You will need someone to drive you home and stay with you the night of your procedure.    Please read over the following fact sheets that you were given:   [facility] Pre[facility]   _x___ TAKE THE FOLLOWING MEDICATION THE MORNING OF SURGERY WITH A SMALL SIP OF WATER. These include:  1. CELEXA (CITALOPRAM)  2.  3.  4.  5.  6.  ____Fleets enema or Magnesium Citrate as directed.   _x___ Use CHG Soap or sage wipes as directed on instruction sheet   ____ Use inhalers on the day of surgery and bring to hospital day of surgery  ____ Stop Metformin and Janumet 2 days prior to surgery.    ____ Take 1/2 of usual insulin dose the night before  surgery and none on the morning surgery.   ____ Follow recommendations from Cardiologist, Pulmonologist or PCP regarding stopping Aspirin, Coumadin, Plavix ,Eliquis, Effient, or Pradaxa, and Pletal.  X____Stop Anti-inflammatories such as Advil, Aleve, Ibuprofen, Motrin, Naproxen, Naprosyn, Goodies powders or aspirin products NOW-OK to take Tylenol    ____ Stop supplements until after surgery.    ____ Bring C-Pap to the hospital.

## 2018-08-23 ENCOUNTER — Encounter: Payer: Self-pay | Admitting: Obstetrics & Gynecology

## 2018-08-23 ENCOUNTER — Ambulatory Visit (INDEPENDENT_AMBULATORY_CARE_PROVIDER_SITE_OTHER): Payer: Medicaid Other | Admitting: Obstetrics & Gynecology

## 2018-08-23 VITALS — BP 130/90 | Ht 67.5 in | Wt 240.0 lb

## 2018-08-23 DIAGNOSIS — D061 Carcinoma in situ of exocervix: Secondary | ICD-10-CM

## 2018-08-23 NOTE — Patient Instructions (Signed)

## 2018-08-23 NOTE — H&P (View-Only) (Signed)
PRE-OPERATIVE HISTORY AND PHYSICAL EXAM  HPI:  Miranda Hoffman is a 32 y.o. [id number] No LMP recorded. Patient has had an injection.; she is being admitted for surgery related to cervical dysplasia.   PAP- LGSIL.  BIOPSY-  Diagnosis 1. Endocervix, curettage - DYSPLASTIC SQUAMOUS EPITHELIUM - BENIGN ENDOCERVICAL GLANDULAR EPITHELIUM - SEE COMMENT 2. Cervix, biopsy, 6 o'clock - BENIGN ENDOCERVICAL GLANDULAR EPITHELIUM - NO SQUAMOUS EPITHELIUM IDENTIFIED 3. Cervix, biopsy, 12 o'clock - HIGH GRADE SQUAMOUS INTRAEPITHELIAL LESION (CIN 2-3; MODERATE TO SEVERE DYSPLASIA).  PMHx: Past Medical History:  Diagnosis Date  . Anxiety   . Asthma    as a child-no inhalers  . Bell's palsy   . Complication of anesthesia    itching with epidural  . Headache    migraines   Past Surgical History:  Procedure Laterality Date  . CESAREAN SECTION    . INDUCED ABORTION     History reviewed. No pertinent family history. Social History   Tobacco Use  . Smoking status: Current Some Day Smoker    Years: 1.00    Types: Cigarettes  . Smokeless tobacco: Never Used  Substance Use Topics  . Alcohol use: Yes    Frequency: Never    Comment: occ wine  . Drug use: Never    Current Outpatient Medications:  .  acetaminophen (TYLENOL) 500 MG tablet, Take 1,000 mg by mouth every 6 (six) hours as needed for moderate pain or headache., Disp: , Rfl:  .  citalopram (CELEXA) 20 MG tablet, Take 1 tablet (20 mg total) by mouth daily. (Patient taking differently: Take 20 mg by mouth every morning. ), Disp: 30 tablet, Rfl: 12 .  ibuprofen (ADVIL,MOTRIN) 600 MG tablet, Take 1 tablet (600 mg total) by mouth every 6 (six) hours as needed for mild pain, moderate pain or cramping., Disp: 30 tablet, Rfl: 0 .  medroxyPROGESTERone (DEPO-PROVERA) 150 MG/ML injection, Inject 1 mL (150 mg total) into the muscle every 3 (three) months., Disp: 1 mL, Rfl: 3 .  ibuprofen (ADVIL,MOTRIN) 200 MG tablet, Take 600 mg by mouth  every 6 (six) hours as needed for moderate pain., Disp: , Rfl:  Allergies: Penicillins  Review of Systems  Constitutional: Negative for chills, fever and malaise/fatigue.  HENT: Negative for congestion, sinus pain and sore throat.   Eyes: Negative for blurred vision and pain.  Respiratory: Negative for cough and wheezing.   Cardiovascular: Negative for chest pain and leg swelling.  Gastrointestinal: Negative for abdominal pain, constipation, diarrhea, heartburn, nausea and vomiting.  Genitourinary: Negative for dysuria, frequency, hematuria and urgency.  Musculoskeletal: Negative for back pain, joint pain, myalgias and neck pain.  Skin: Negative for itching and rash.  Neurological: Negative for dizziness, tremors and weakness.  Endo/Heme/Allergies: Does not bruise/bleed easily.  Psychiatric/Behavioral: Negative for depression. The patient is not nervous/anxious and does not have insomnia.     Objective: BP 130/90   Ht 5' 7.5" (1.715 m)   Wt 240 lb (108.9 kg)   BMI 37.03 kg/m   Filed Weights   08/23/18 1104  Weight: 240 lb (108.9 kg)   Physical Exam Constitutional:      General: She is not in acute distress.    Appearance: She is well-developed.  HENT:     Head: Normocephalic and atraumatic. No laceration.     Right Ear: Hearing normal.     Left Ear: Hearing normal.     Mouth/Throat:     Pharynx: Uvula midline.  Eyes:  Pupils: Pupils are equal, round, and reactive to light.  Neck:     Musculoskeletal: Normal range of motion and neck supple.     Thyroid: No thyromegaly.  Cardiovascular:     Rate and Rhythm: Normal rate and regular rhythm.     Heart sounds: No murmur. No friction rub. No gallop.   Pulmonary:     Effort: Pulmonary effort is normal. No respiratory distress.     Breath sounds: Normal breath sounds. No wheezing.  Chest:     Breasts:        Right: No mass, skin change or tenderness.        Left: No mass, skin change or tenderness.  Abdominal:      General: Bowel sounds are normal. There is no distension.     Palpations: Abdomen is soft.     Tenderness: There is no abdominal tenderness. There is no rebound.  Musculoskeletal: Normal range of motion.  Neurological:     Mental Status: She is alert and oriented to person, place, and time.     Cranial Nerves: No cranial nerve deficit.  Skin:    General: Skin is warm and dry.  Psychiatric:        Judgment: Judgment normal.  Vitals signs reviewed.    Assessment: 1. 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 I have fully informed this patient that surgery may subject her to a variety of discomforts and risks: She understands that most patients have surgery with little difficulty, but problems can happen ranging from minor to fatal. These include nausea, vomiting, pain, bleeding, infection, poor healing, hernia, or formation of adhesions. Unexpected reactions may occur from any drug or anesthetic given. Unintended injury may occur to other pelvic or abdominal structures such as Fallopian tubes, ovaries, bladder, ureter (tube from kidney to bladder), or bowel. Nerves going from the pelvis to the legs may be injured. Any such injury may require immediate or later additional surgery to correct the problem. Excessive blood loss requiring transfusion is very unlikely but possible. Dangerous blood clots may form in the legs or lungs. Physical and sexual activity will be restricted in varying degrees for an indeterminate period of time but most often 2-6 weeks.  Finally, she understands that it is impossible to list every possible undesirable effect and that the condition for which surgery is done is not always cured or significantly improved, and in rare cases may be even worse.Ample time was given to answer all questions.  Paul Harris, MD, FACOG [facility], [facility] 08/23/2018  11:21 AM  

## 2018-08-23 NOTE — Progress Notes (Signed)
PRE-OPERATIVE HISTORY AND PHYSICAL EXAM  HPI:  Miranda Hoffman is a 32 y.o. [id number] No LMP recorded. Patient has had an injection.; she is being admitted for surgery related to cervical dysplasia.   PAP- LGSIL.  BIOPSY-  Diagnosis 1. Endocervix, curettage - DYSPLASTIC SQUAMOUS EPITHELIUM - BENIGN ENDOCERVICAL GLANDULAR EPITHELIUM - SEE COMMENT 2. Cervix, biopsy, 6 o'clock - BENIGN ENDOCERVICAL GLANDULAR EPITHELIUM - NO SQUAMOUS EPITHELIUM IDENTIFIED 3. Cervix, biopsy, 12 o'clock - HIGH GRADE SQUAMOUS INTRAEPITHELIAL LESION (CIN 2-3; MODERATE TO SEVERE DYSPLASIA).  PMHx: Past Medical History:  Diagnosis Date  . Anxiety   . Asthma    as a child-no inhalers  . Bell's palsy   . Complication of anesthesia    itching with epidural  . Headache    migraines   Past Surgical History:  Procedure Laterality Date  . CESAREAN SECTION    . INDUCED ABORTION     History reviewed. No pertinent family history. Social History   Tobacco Use  . Smoking status: Current Some Day Smoker    Years: 1.00    Types: Cigarettes  . Smokeless tobacco: Never Used  Substance Use Topics  . Alcohol use: Yes    Frequency: Never    Comment: occ wine  . Drug use: Never    Current Outpatient Medications:  .  acetaminophen (TYLENOL) 500 MG tablet, Take 1,000 mg by mouth every 6 (six) hours as needed for moderate pain or headache., Disp: , Rfl:  .  citalopram (CELEXA) 20 MG tablet, Take 1 tablet (20 mg total) by mouth daily. (Patient taking differently: Take 20 mg by mouth every morning. ), Disp: 30 tablet, Rfl: 12 .  ibuprofen (ADVIL,MOTRIN) 600 MG tablet, Take 1 tablet (600 mg total) by mouth every 6 (six) hours as needed for mild pain, moderate pain or cramping., Disp: 30 tablet, Rfl: 0 .  medroxyPROGESTERone (DEPO-PROVERA) 150 MG/ML injection, Inject 1 mL (150 mg total) into the muscle every 3 (three) months., Disp: 1 mL, Rfl: 3 .  ibuprofen (ADVIL,MOTRIN) 200 MG tablet, Take 600 mg by mouth  every 6 (six) hours as needed for moderate pain., Disp: , Rfl:  Allergies: Penicillins  Review of Systems  Constitutional: Negative for chills, fever and malaise/fatigue.  HENT: Negative for congestion, sinus pain and sore throat.   Eyes: Negative for blurred vision and pain.  Respiratory: Negative for cough and wheezing.   Cardiovascular: Negative for chest pain and leg swelling.  Gastrointestinal: Negative for abdominal pain, constipation, diarrhea, heartburn, nausea and vomiting.  Genitourinary: Negative for dysuria, frequency, hematuria and urgency.  Musculoskeletal: Negative for back pain, joint pain, myalgias and neck pain.  Skin: Negative for itching and rash.  Neurological: Negative for dizziness, tremors and weakness.  Endo/Heme/Allergies: Does not bruise/bleed easily.  Psychiatric/Behavioral: Negative for depression. The patient is not nervous/anxious and does not have insomnia.     Objective: BP 130/90   Ht 5' 7.5" (1.715 m)   Wt 240 lb (108.9 kg)   BMI 37.03 kg/m   Filed Weights   08/23/18 1104  Weight: 240 lb (108.9 kg)   Physical Exam Constitutional:      General: She is not in acute distress.    Appearance: She is well-developed.  HENT:     Head: Normocephalic and atraumatic. No laceration.     Right Ear: Hearing normal.     Left Ear: Hearing normal.     Mouth/Throat:     Pharynx: Uvula midline.  Eyes:  Pupils: Pupils are equal, round, and reactive to light.  Neck:     Musculoskeletal: Normal range of motion and neck supple.     Thyroid: No thyromegaly.  Cardiovascular:     Rate and Rhythm: Normal rate and regular rhythm.     Heart sounds: No murmur. No friction rub. No gallop.   Pulmonary:     Effort: Pulmonary effort is normal. No respiratory distress.     Breath sounds: Normal breath sounds. No wheezing.  Chest:     Breasts:        Right: No mass, skin change or tenderness.        Left: No mass, skin change or tenderness.  Abdominal:      General: Bowel sounds are normal. There is no distension.     Palpations: Abdomen is soft.     Tenderness: There is no abdominal tenderness. There is no rebound.  Musculoskeletal: Normal range of motion.  Neurological:     Mental Status: She is alert and oriented to person, place, and time.     Cranial Nerves: No cranial nerve deficit.  Skin:    General: Skin is warm and dry.  Psychiatric:        Judgment: Judgment normal.  Vitals signs reviewed.    Assessment: 1. 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 I have fully informed this patient that surgery may subject her to a variety of discomforts and risks: She understands that most patients have surgery with little difficulty, but problems can happen ranging from minor to fatal. These include nausea, vomiting, pain, bleeding, infection, poor healing, hernia, or formation of adhesions. Unexpected reactions may occur from any drug or anesthetic given. Unintended injury may occur to other pelvic or abdominal structures such as Fallopian tubes, ovaries, bladder, ureter (tube from kidney to bladder), or bowel. Nerves going from the pelvis to the legs may be injured. Any such injury may require immediate or later additional surgery to correct the problem. Excessive blood loss requiring transfusion is very unlikely but possible. Dangerous blood clots may form in the legs or lungs. Physical and sexual activity will be restricted in varying degrees for an indeterminate period of time but most often 2-6 weeks.  Finally, she understands that it is impossible to list every possible undesirable effect and that the condition for which surgery is done is not always cured or significantly improved, and in rare cases may be even worse.Ample time was given to answer all questions.  Annamarie MajorPaul Karishma Unrein, MD, Merlinda FrederickFACOG [facility], [facility] 08/23/2018  11:21 AM

## 2018-08-24 ENCOUNTER — Encounter
Admission: RE | Disposition: A | Discharge status: Home / Self Care | Payer: Self-pay | Source: Home / Self Care | Attending: Obstetrics & Gynecology

## 2018-08-24 ENCOUNTER — Ambulatory Visit: Payer: Medicaid Other | Admitting: Anesthesiology

## 2018-08-24 ENCOUNTER — Ambulatory Visit
Admission: RE | Admit: 2018-08-24 | Discharge: 2018-08-24 | Disposition: A | Discharge status: Home / Self Care | Payer: Medicaid Other | Attending: Obstetrics & Gynecology | Admitting: Obstetrics & Gynecology

## 2018-08-24 ENCOUNTER — Encounter: Payer: Self-pay | Admitting: *Deleted

## 2018-08-24 ENCOUNTER — Other Ambulatory Visit: Payer: Self-pay

## 2018-08-24 DIAGNOSIS — Z880 Allergy status to penicillin: Secondary | ICD-10-CM | POA: Diagnosis not present

## 2018-08-24 DIAGNOSIS — N870 Mild cervical dysplasia: Secondary | ICD-10-CM | POA: Diagnosis not present

## 2018-08-24 DIAGNOSIS — F418 Other specified anxiety disorders: Secondary | ICD-10-CM | POA: Diagnosis not present

## 2018-08-24 DIAGNOSIS — Z8709 Personal history of other diseases of the respiratory system: Secondary | ICD-10-CM | POA: Diagnosis not present

## 2018-08-24 DIAGNOSIS — D069 Carcinoma in situ of cervix, unspecified: Secondary | ICD-10-CM | POA: Diagnosis not present

## 2018-08-24 DIAGNOSIS — Z79899 Other long term (current) drug therapy: Secondary | ICD-10-CM | POA: Insufficient documentation

## 2018-08-24 DIAGNOSIS — Z793 Long term (current) use of hormonal contraceptives: Secondary | ICD-10-CM | POA: Diagnosis not present

## 2018-08-24 DIAGNOSIS — F17210 Nicotine dependence, cigarettes, uncomplicated: Secondary | ICD-10-CM | POA: Diagnosis not present

## 2018-08-24 DIAGNOSIS — N871 Moderate cervical dysplasia: Secondary | ICD-10-CM | POA: Diagnosis present

## 2018-08-24 HISTORY — PX: LEEP: SHX91

## 2018-08-24 LAB — POCT PREGNANCY, URINE: Preg Test, Ur: NEGATIVE

## 2018-08-24 SURGERY — LEEP (LOOP ELECTROSURGICAL EXCISION PROCEDURE)
Anesthesia: General

## 2018-08-24 MED ORDER — FAMOTIDINE 20 MG PO TABS
20.0000 mg | ORAL_TABLET | Freq: Once | ORAL | Status: AC
  Administered 2018-08-24: 20 mg via ORAL

## 2018-08-24 MED ORDER — IODINE STRONG (LUGOLS) 5 % PO SOLN
ORAL | Status: AC
  Filled 2018-08-24: qty 1

## 2018-08-24 MED ORDER — OXYCODONE-ACETAMINOPHEN 5-325 MG PO TABS: 1.0000 | ORAL_TABLET | ORAL | 0 refills | Status: AC | PRN

## 2018-08-24 MED ORDER — ACETAMINOPHEN 325 MG PO TABS: 650.0000 mg | ORAL_TABLET | ORAL | Status: DC | PRN

## 2018-08-24 MED ORDER — MIDAZOLAM HCL 2 MG/2ML IJ SOLN
INTRAMUSCULAR | Status: AC
  Filled 2018-08-24: qty 2

## 2018-08-24 MED ORDER — LIDOCAINE HCL (PF) 2 % IJ SOLN
INTRAMUSCULAR | Status: AC
  Filled 2018-08-24: qty 10

## 2018-08-24 MED ORDER — ACETAMINOPHEN 650 MG RE SUPP
650.0000 mg | RECTAL | Status: DC | PRN
  Filled 2018-08-24: qty 1

## 2018-08-24 MED ORDER — DEXAMETHASONE SODIUM PHOSPHATE 10 MG/ML IJ SOLN
INTRAMUSCULAR | Status: DC | PRN
  Administered 2018-08-24: 10 mg via INTRAVENOUS

## 2018-08-24 MED ORDER — LACTATED RINGERS IV SOLN: INTRAVENOUS | Status: DC

## 2018-08-24 MED ORDER — KETOROLAC TROMETHAMINE 30 MG/ML IJ SOLN
INTRAMUSCULAR | Status: DC | PRN
  Administered 2018-08-24: 30 mg via INTRAVENOUS

## 2018-08-24 MED ORDER — LIDOCAINE HCL (CARDIAC) PF 100 MG/5ML IV SOSY
PREFILLED_SYRINGE | INTRAVENOUS | Status: DC | PRN
  Administered 2018-08-24: 60 mg via INTRAVENOUS
  Administered 2018-08-24: 40 mg via INTRAVENOUS

## 2018-08-24 MED ORDER — FENTANYL CITRATE (PF) 100 MCG/2ML IJ SOLN
INTRAMUSCULAR | Status: AC
  Filled 2018-08-24: qty 2

## 2018-08-24 MED ORDER — OXYCODONE-ACETAMINOPHEN 5-325 MG PO TABS: 1.0000 | ORAL_TABLET | ORAL | Status: DC | PRN

## 2018-08-24 MED ORDER — LIDOCAINE-EPINEPHRINE 1 %-1:100000 IJ SOLN
INTRAMUSCULAR | Status: AC
  Filled 2018-08-24: qty 1

## 2018-08-24 MED ORDER — LIDOCAINE-EPINEPHRINE 1 %-1:100000 IJ SOLN
INTRAMUSCULAR | Status: DC | PRN
  Administered 2018-08-24: 30 mL

## 2018-08-24 MED ORDER — FENTANYL CITRATE (PF) 100 MCG/2ML IJ SOLN
INTRAMUSCULAR | Status: DC | PRN
  Administered 2018-08-24: 50 ug via INTRAVENOUS

## 2018-08-24 MED ORDER — PROMETHAZINE HCL 25 MG/ML IJ SOLN: 6.2500 mg | INTRAMUSCULAR | Status: DC | PRN

## 2018-08-24 MED ORDER — IBUPROFEN 600 MG PO TABS: 600.0000 mg | ORAL_TABLET | Freq: Four times a day (QID) | ORAL | 0 refills | Status: AC | PRN

## 2018-08-24 MED ORDER — ONDANSETRON HCL 4 MG/2ML IJ SOLN
INTRAMUSCULAR | Status: DC | PRN
  Administered 2018-08-24: 4 mg via INTRAVENOUS

## 2018-08-24 MED ORDER — FAMOTIDINE 20 MG PO TABS
ORAL_TABLET | ORAL | Status: AC
  Administered 2018-08-24: 20 mg via ORAL
  Filled 2018-08-24: qty 1

## 2018-08-24 MED ORDER — MORPHINE SULFATE (PF) 4 MG/ML IV SOLN: 1.0000 mg | INTRAVENOUS | Status: DC | PRN

## 2018-08-24 MED ORDER — DEXAMETHASONE SODIUM PHOSPHATE 10 MG/ML IJ SOLN
INTRAMUSCULAR | Status: AC
  Filled 2018-08-24: qty 1

## 2018-08-24 MED ORDER — LACTATED RINGERS IV SOLN
INTRAVENOUS | Status: DC
  Administered 2018-08-24: 09:00:00 via INTRAVENOUS

## 2018-08-24 MED ORDER — MIDAZOLAM HCL 2 MG/2ML IJ SOLN
INTRAMUSCULAR | Status: DC | PRN
  Administered 2018-08-24: 2 mg via INTRAVENOUS

## 2018-08-24 MED ORDER — PROPOFOL 10 MG/ML IV BOLUS
INTRAVENOUS | Status: AC
  Filled 2018-08-24: qty 20

## 2018-08-24 MED ORDER — KETOROLAC TROMETHAMINE 30 MG/ML IJ SOLN
30.0000 mg | Freq: Four times a day (QID) | INTRAMUSCULAR | Status: DC
  Filled 2018-08-24: qty 1

## 2018-08-24 MED ORDER — ONDANSETRON HCL 4 MG/2ML IJ SOLN
INTRAMUSCULAR | Status: AC
  Filled 2018-08-24: qty 2

## 2018-08-24 MED ORDER — FENTANYL CITRATE (PF) 100 MCG/2ML IJ SOLN
25.0000 ug | INTRAMUSCULAR | Status: DC | PRN
  Administered 2018-08-24: 50 ug via INTRAVENOUS

## 2018-08-24 MED ORDER — KETOROLAC TROMETHAMINE 30 MG/ML IJ SOLN
INTRAMUSCULAR | Status: AC
  Filled 2018-08-24: qty 1

## 2018-08-24 MED ORDER — LACTATED RINGERS IV SOLN
INTRAVENOUS | Status: DC
  Administered 2018-08-24: 08:00:00 via INTRAVENOUS

## 2018-08-24 MED ORDER — PROPOFOL 10 MG/ML IV BOLUS
INTRAVENOUS | Status: DC | PRN
  Administered 2018-08-24: 200 mg via INTRAVENOUS

## 2018-08-24 MED ORDER — FERRIC SUBSULFATE 259 MG/GM EX SOLN
CUTANEOUS | Status: AC
  Filled 2018-08-24: qty 8

## 2018-08-24 SURGICAL SUPPLY — 35 items
APPLICATOR COTTON TIP 6IN STRL (MISCELLANEOUS) ×2 IMPLANT
BAG COUNTER SPONGE EZ (MISCELLANEOUS) ×2 IMPLANT
CANISTER SUCT 1200ML W/VALVE (MISCELLANEOUS) ×2 IMPLANT
CATH ROBINSON RED A/P 16FR (CATHETERS) ×2 IMPLANT
CNTNR SPEC 2.5X3XGRAD LEK (MISCELLANEOUS) ×2
CONT SPEC 4OZ STER OR WHT (MISCELLANEOUS) ×2
CONTAINER SPEC 2.5X3XGRAD LEK (MISCELLANEOUS) ×2 IMPLANT
COVER WAND RF STERILE (DRAPES) ×2 IMPLANT
ELECT LEEP BALL 5MM 12CM (MISCELLANEOUS) ×2
ELECT LEEP LOOP 20X10 R2010 (MISCELLANEOUS) ×2
ELECT LOOP 1.0X1.0CM R1010 (MISCELLANEOUS) ×2
ELECT REM PT RETURN 9FT ADLT (ELECTROSURGICAL) ×2
ELECTRODE LEEP BALL 5MM 12CM (MISCELLANEOUS) ×1 IMPLANT
ELECTRODE LEP LOOP 20X10 R2010 (MISCELLANEOUS) ×1 IMPLANT
ELECTRODE LOOP 1.0X1.0CM R1010 (MISCELLANEOUS) ×1 IMPLANT
ELECTRODE REM PT RTRN 9FT ADLT (ELECTROSURGICAL) ×1 IMPLANT
GLOVE BIO SURGEON STRL SZ8 (GLOVE) ×2 IMPLANT
GOWN STRL REUS W/ TWL LRG LVL3 (GOWN DISPOSABLE) ×1 IMPLANT
GOWN STRL REUS W/ TWL XL LVL3 (GOWN DISPOSABLE) ×1 IMPLANT
GOWN STRL REUS W/TWL LRG LVL3 (GOWN DISPOSABLE) ×1
GOWN STRL REUS W/TWL XL LVL3 (GOWN DISPOSABLE) ×1
NEEDLE SPNL 22GX3.5 QUINCKE BK (NEEDLE) ×2 IMPLANT
NS IRRIG 500ML POUR BTL (IV SOLUTION) ×2 IMPLANT
PACK DNC HYST (MISCELLANEOUS) ×2 IMPLANT
PAD OB MATERNITY 4.3X12.25 (PERSONAL CARE ITEMS) ×2 IMPLANT
PAD PREP 24X41 OB/GYN DISP (PERSONAL CARE ITEMS) ×2 IMPLANT
PENCIL ELECTRO HAND CTR (MISCELLANEOUS) ×2 IMPLANT
STRAP SAFETY 5IN WIDE (MISCELLANEOUS) ×2 IMPLANT
STRAW SMOKE EVAC LEEP 6150 NON (MISCELLANEOUS) ×2 IMPLANT
SUT ETHILON 3-0 FS-10 30 BLK (SUTURE) ×2
SUT VIC AB 2-0 SH 27 (SUTURE) ×1
SUT VIC AB 2-0 SH 27XBRD (SUTURE) ×1 IMPLANT
SUTURE EHLN 3-0 FS-10 30 BLK (SUTURE) ×1 IMPLANT
SYR CONTROL 10ML (SYRINGE) ×4 IMPLANT
TUBING CONNECTING 10 (TUBING) ×2 IMPLANT

## 2018-08-24 NOTE — Interval H&P Note (Signed)
History and Physical Interval Note:  08/24/2018 8:31 AM  Miranda Hoffman  has presented today for surgery, with the diagnosis of CIN3  The various methods of treatment have been discussed with the patient and family. After consideration of risks, benefits and other options for treatment, the patient has consented to  Procedure(s): LOOP ELECTROSURGICAL EXCISION PROCEDURE (LEEP) (N/A) as a surgical intervention .  The patient's history has been reviewed, patient examined, no change in status, stable for surgery.  I have reviewed the patient's chart and labs.  Questions were answered to the patient's satisfaction.     Letitia Libraobert Paul Caycee Wanat

## 2018-08-24 NOTE — Op Note (Signed)
Operative Note   SURGERY DATE: 08/24/2018  PRE-OP DIAGNOSIS: Cervical Dysplasia Grade 2-3  POST-OP DIAGNOSIS: same  PROCEDURE: Exam under anesthesia, loop electricosurgical excisional procedure, endocervical curettage  SURGEON: Annamarie MajorPaul Harris, MD, FACOG  ANESTHESIA: Choice   ESTIMATED BLOOD LOSS: Minimal   SPECIMENS: ECC, Endocervical LEEP (x2), Ectocervical LEEP (x2)  COMPLICATIONS: None  INDICATIONS: Cervical Dysplasia with CIN II-III  FINDINGS: EGBUS and vagina within normal limits. Cervix grossly showed no lesions.    PROCEDURE IN DETAIL: After informed consent was obtained, the patient was taken to the operating room where general anesthesia was administered without difficulty. The patient was positioned in the dorsal lithotomy position in [place] stirrups. Time-out was performed. The patient was examined under anesthesia, and the above findings were noted. She was prepped and draped in normal sterile fashion. The patient's bladder was cathaterized with an in and out catheter.  A weighted speculum and deaver were placed inside the patient's vagina. The above mentioned findings were noted. A sound was then used to trace the canal and it's width.  Using the LEEP device and loops for ecto- and endo- cervical specimens,  the conization was performed, making sure to encompass the entire lesion and transformation zone and to take a deeper second specimen. The cone bed was then cauterized to achieve hemostasis. Speculum was removed and patient awoken from anesthesia.  The patient tolerated the procedure well. Sponge, lap, needle, and instrument counts were correct x 2. The patient was transferred to the recovery room awake, alert and breathing independently in stable condition.  Annamarie MajorPaul Harris, MD, Merlinda FrederickFACOG [facility], [facility] 08/24/2018  9:28 AM

## 2018-08-24 NOTE — Anesthesia Procedure Notes (Signed)
Procedure Name: LMA Insertion Date/Time: 08/24/2018 8:57 AM Performed by: Omer JackWeatherly, Nasya Vincent, CRNA Pre-anesthesia Checklist: Patient identified, Patient being monitored, Timeout performed, Emergency Drugs available and Suction available Patient Re-evaluated:Patient Re-evaluated prior to induction Oxygen Delivery Method: Circle system utilized Preoxygenation: Pre-oxygenation with 100% oxygen Induction Type: IV induction Ventilation: Mask ventilation without difficulty LMA: LMA inserted LMA Size: 4.0 Tube type: Oral Number of attempts: 1 Placement Confirmation: positive ETCO2 and breath sounds checked- equal and bilateral Tube secured with: Tape Dental Injury: Teeth and Oropharynx as per pre-operative assessment

## 2018-08-24 NOTE — Anesthesia Post-op Follow-up Note (Signed)
Anesthesia QCDR form completed.        

## 2018-08-24 NOTE — Discharge Instructions (Signed)
AMBULATORY SURGERY  DISCHARGE INSTRUCTIONS   1) The drugs that you were given will stay in your system until tomorrow so for the next 24 hours you should not:  A) Drive an automobile B) Make any legal decisions C) Drink any alcoholic beverage   2) You may resume regular meals tomorrow.  Today it is better to start with liquids and gradually work up to solid foods.  You may eat anything you prefer, but it is better to start with liquids, then soup and crackers, and gradually work up to solid foods.   3) Please notify your doctor immediately if you have any unusual bleeding, trouble breathing, redness and pain at the surgery site, drainage, fever, or pain not relieved by medication.    4) Additional Instructions:        Please contact your physician with any problems or Same Day Surgery at [phone], Monday through Friday 6 am to 4 pm, or [place] at [facility] Main number at [phone].LEEP Cervical Conization, Care After This sheet gives you information about how to care for yourself after your procedure. Your doctor may also give you more specific instructions. If you have problems or questions, contact your doctor. Follow these instructions at home: Medicines   Take over-the-counter and prescription medicines only as told by your doctor.  Do not take aspirin until your doctor says it is okay.  If you take pain medicine: ? You may have constipation. To help treat this, your doctor may tell you to:  Drink enough fluid to keep your pee (urine) clear or pale yellow.  Take medicines.  Eat foods that are high in fiber. These include fresh fruits and vegetables, whole grains, bran, and beans.  Limit foods that are high in fat and sugar. These include fried foods and sweet foods. ? Do not drive or use heavy machines. General instructions  You can eat your usual diet unless your doctor tells you not to do so.  Take showers for the first week. Do not take baths,  swim, or use hot tubs until your doctor says it is okay.  Do not douche, use tampons, or have sex until your doctor says it is okay.  For 7-14 days after your procedure, avoid: ? Being very active. ? Exercising. ? Heavy lifting.  Keep all follow-up visits as told by your doctor. This is important. Contact a doctor if:  You have a rash.  You are dizzy or lightheaded.  You feel sick to your stomach (nauseous).  You throw up (vomit).  You have fluid from your vagina (vaginal discharge) that smells bad. Get help right away if:  There are blood clots coming from your vagina.  You have more bleeding than you would have in a normal period. For example, you soak a pad in less than 1 hour.  You have a fever.  You have more and more cramps.  You pass out (faint).  You have pain when peeing.  Your have a lot of pain.  Your pain gets worse.  Your pain does not get better when you take your medicine.  You have blood in your pee.  You throw up (vomit). Summary  After your procedure, take over-the-counter and prescription medicines only as told by your doctor.  Do not douche, use tampons, or have sex until your doctor says it is okay.  For about 7-14 days after your procedure, try not to exercise or lift heavy objects.  Get help right away if you have new  symptoms, or if your symptoms become worse. This information is not intended to replace advice given to you by your health care provider. Make sure you discuss any questions you have with your health care provider. Document Released: 05/20/2008 Document Revised: 08/13/2016 Document Reviewed: 08/13/2016 Elsevier Interactive Patient Education  2019 [employer]

## 2018-08-24 NOTE — Anesthesia Preprocedure Evaluation (Signed)
Anesthesia Evaluation  Patient identified by MRN, date of birth, ID band Patient awake    Reviewed: Allergy & Precautions, H&P , NPO status , Patient's Chart, lab work & pertinent test results, reviewed documented beta blocker date and time   History of Anesthesia Complications (+) history of anesthetic complications (Itching with an epidural)  Airway Mallampati: III  TM Distance: >3 FB Neck ROM: full    Dental  (+) Dental Advidsory Given, Teeth Intact   Pulmonary neg shortness of breath, asthma (as a child) , neg sleep apnea, Recent URI , Current Smoker,           Cardiovascular Exercise Tolerance: Good negative cardio ROS       Neuro/Psych neg Seizures PSYCHIATRIC DISORDERS Anxiety Depression  Neuromuscular disease    GI/Hepatic negative GI ROS, Neg liver ROS,   Endo/Other  negative endocrine ROS  Renal/GU negative Renal ROS  negative genitourinary   Musculoskeletal   Abdominal   Peds  Hematology negative hematology ROS (+)   Anesthesia Other Findings Past Medical History: No date: Anxiety No date: Asthma     Comment:  as a child-no inhalers No date: Bell's palsy No date: Complication of anesthesia     Comment:  itching with epidural No date: Headache     Comment:  migraines   Reproductive/Obstetrics negative OB ROS                             Anesthesia Physical Anesthesia Plan  ASA: II  Anesthesia Plan: General   Post-op Pain Management:    Induction: Intravenous  PONV Risk Score and Plan: 2 and Ondansetron, Dexamethasone, Midazolam and Treatment may vary due to age or medical condition  Airway Management Planned: LMA  Additional Equipment:   Intra-op Plan:   Post-operative Plan: Extubation in OR  Informed Consent: I have reviewed the patients History and Physical, chart, labs and discussed the procedure including the risks, benefits and alternatives for the  proposed anesthesia with the patient or authorized representative who has indicated his/her understanding and acceptance.   Dental Advisory Given  Plan Discussed with: Anesthesiologist, CRNA and Surgeon  Anesthesia Plan Comments:         Anesthesia Quick Evaluation

## 2018-08-24 NOTE — Transfer of Care (Signed)
Immediate Anesthesia Transfer of Care Note  Patient: Margaretha SeedsJuakisha T Decook  Procedure(s) Performed: LOOP ELECTROSURGICAL EXCISION PROCEDURE (LEEP) (N/A )  Patient Location: PACU  Anesthesia Type:General  Level of Consciousness: awake, alert  and oriented  Airway & Oxygen Therapy: Patient Spontanous Breathing and Patient connected to face mask oxygen  Post-op Assessment: Report given to RN and Post -op Vital signs reviewed and stable  Post vital signs: Reviewed and stable  Last Vitals:  Vitals Value Taken Time  BP 129/77 08/24/2018  9:41 AM  Temp 36.1 C 08/24/2018  9:41 AM  Pulse 96 08/24/2018  9:42 AM  Resp 17 08/24/2018  9:42 AM  SpO2 100 % 08/24/2018  9:42 AM  Vitals shown include unvalidated device data.  Last Pain:  Vitals:   08/24/18 0941  TempSrc:   PainSc: 3          Complications: No apparent anesthesia complications

## 2018-08-24 NOTE — Anesthesia Postprocedure Evaluation (Signed)
Anesthesia Post Note  Patient: Miranda Hoffman  Procedure(s) Performed: LOOP ELECTROSURGICAL EXCISION PROCEDURE (LEEP) (N/A )  Patient location during evaluation: PACU Anesthesia Type: General Level of consciousness: awake and alert Pain management: pain level controlled Vital Signs Assessment: post-procedure vital signs reviewed and stable Respiratory status: spontaneous breathing, nonlabored ventilation, respiratory function stable and patient connected to nasal cannula oxygen Cardiovascular status: blood pressure returned to baseline and stable Postop Assessment: no apparent nausea or vomiting Anesthetic complications: no     Last Vitals:  Vitals:   08/24/18 1033 08/24/18 1059  BP: 131/81 133/78  Pulse: 69 78  Resp: 20 18  Temp: 36.7 C   SpO2: 97% 97%    Last Pain:  Vitals:   08/24/18 1033  TempSrc: Temporal  PainSc: 2                  Lenard SimmerAndrew Gisela Lea

## 2018-08-26 LAB — SURGICAL PATHOLOGY

## 2018-09-09 ENCOUNTER — Encounter: Payer: Self-pay | Admitting: Obstetrics & Gynecology

## 2018-09-09 ENCOUNTER — Ambulatory Visit (INDEPENDENT_AMBULATORY_CARE_PROVIDER_SITE_OTHER): Payer: Medicaid Other | Admitting: Obstetrics & Gynecology

## 2018-09-09 VITALS — BP 100/70 | Ht 67.0 in | Wt 237.0 lb

## 2018-09-09 DIAGNOSIS — D061 Carcinoma in situ of exocervix: Secondary | ICD-10-CM

## 2018-09-09 NOTE — Progress Notes (Signed)
  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  Pathology: DIAGNOSIS:  A. UTERUS; ENDOCERVICAL CURETTINGS:  - BENIGN ENDOCERVICAL MUCOSA.   B. UTERUS, ECTOCERVICAL, 12 PM; LEEP CONIZATION:  - FOCAL LOW-GRADE SQUAMOUS INTRAEPITHELIAL LESION (CIN-1).  - NEGATIVE FOR HIGH-GRADE DYSPLASIA AND MALIGNANCY.  - MARGIN NEGATIVE FOR DYSPLASIA.    C. UTERUS, ECTOCERVICAL, 6 PM; LEEP CONIZATION:  - BENIGN ECTOCERVICAL AND ENDOCERVICAL MUCOSA WITH CHRONIC INFLAMMATION.  - NEGATIVE FOR DYSPLASIA AND MALIGNANCY.   D. UTERUS, ENDOCERVIX, LEEP CONIZATION:  - BENIGN ENDOCERVIX.  - NEGATIVE FOR DYSPLASIA AND MALIGNANCY.   Subjective: Patient reports min bleeding and no pain. Eating a regular diet without difficulty. The patient is not having any pain.  Activity: normal activities of daily living. Patient reports additional symptom's since surgery of None.  Objective: BP 100/70   Ht 5\' 7"  (1.702 m)   Wt 237 lb (107.5 kg)   BMI 37.12 kg/m  Physical Exam Constitutional:      General: She is not in acute distress.    Appearance: She is well-developed.  Genitourinary:     Pelvic exam was performed with patient supine.     Vagina, uterus and rectum normal.     No vaginal erythema or bleeding.     No cervical motion tenderness, discharge or lesion.     Uterus is not enlarged or tender.     No uterine mass detected.    Uterus is midaxial.     No right or left adnexal mass present.     Right adnexa not tender.     Left adnexa not tender.  Cardiovascular:     Rate and Rhythm: Normal rate.  Pulmonary:     Effort: Pulmonary effort is normal.  Abdominal:     General: There is no distension.     Palpations: Abdomen is soft.     Tenderness: There is no abdominal tenderness.  Musculoskeletal: Normal range of motion.  Neurological:     Mental Status: She is alert and oriented to person, place, and time.   Cranial Nerves: No cranial nerve deficit.  Skin:    General: Skin is warm and dry.   Assessment: s/p :  LEEP progressing well  Plan: Patient has done well after surgery with no apparent complications.  I have discussed the post-operative course to date, and the expected progress moving forward.  The patient understands what complications to be concerned about.  I will see the patient in routine follow up, or sooner if needed.  PLAN PAP in 3 months.  Activity plan: No restriction.  Miranda Hoffman 09/09/2018, 8:43 AM

## 2018-09-09 NOTE — Patient Instructions (Signed)
PAP 3 MONTHS    Then 6 months if normal       Then yearly once series of normals

## 2018-09-10 ENCOUNTER — Encounter: Payer: Medicaid Other | Admitting: Obstetrics & Gynecology

## 2018-09-13 ENCOUNTER — Other Ambulatory Visit: Payer: Medicaid Other

## 2018-10-01 ENCOUNTER — Ambulatory Visit: Payer: Medicaid Other

## 2019-01-07 ENCOUNTER — Ambulatory Visit: Payer: Medicaid Other | Admitting: Obstetrics & Gynecology

## 2019-01-20 ENCOUNTER — Ambulatory Visit: Payer: Medicaid Other | Admitting: Obstetrics & Gynecology

## 2019-03-24 ENCOUNTER — Ambulatory Visit: Payer: Medicaid Other | Admitting: Obstetrics & Gynecology

## 2019-07-25 DIAGNOSIS — Z6841 Body Mass Index (BMI) 40.0 and over, adult: Secondary | ICD-10-CM

## 2019-07-25 DIAGNOSIS — Z8759 Personal history of other complications of pregnancy, childbirth and the puerperium: Secondary | ICD-10-CM

## 2019-07-26 ENCOUNTER — Ambulatory Visit: Payer: Medicaid Other | Admitting: Family Medicine

## 2019-07-26 ENCOUNTER — Ambulatory Visit (LOCAL_COMMUNITY_HEALTH_CENTER): Payer: Self-pay

## 2019-07-26 ENCOUNTER — Other Ambulatory Visit: Payer: Self-pay

## 2019-07-26 ENCOUNTER — Encounter: Payer: Self-pay | Admitting: Family Medicine

## 2019-07-26 DIAGNOSIS — Z23 Encounter for immunization: Secondary | ICD-10-CM

## 2019-07-26 DIAGNOSIS — Z1388 Encounter for screening for disorder due to exposure to contaminants: Secondary | ICD-10-CM | POA: Diagnosis not present

## 2019-07-26 DIAGNOSIS — Z3009 Encounter for other general counseling and advice on contraception: Secondary | ICD-10-CM | POA: Diagnosis not present

## 2019-07-26 DIAGNOSIS — B9689 Other specified bacterial agents as the cause of diseases classified elsewhere: Secondary | ICD-10-CM | POA: Diagnosis not present

## 2019-07-26 DIAGNOSIS — Z0389 Encounter for observation for other suspected diseases and conditions ruled out: Secondary | ICD-10-CM | POA: Diagnosis not present

## 2019-07-26 DIAGNOSIS — Z113 Encounter for screening for infections with a predominantly sexual mode of transmission: Secondary | ICD-10-CM | POA: Diagnosis not present

## 2019-07-26 DIAGNOSIS — N760 Acute vaginitis: Secondary | ICD-10-CM

## 2019-07-26 LAB — WET PREP FOR TRICH, YEAST, CLUE
Trichomonas Exam: NEGATIVE
Yeast Exam: NEGATIVE

## 2019-07-26 MED ORDER — METRONIDAZOLE 500 MG PO TABS: 500.0000 mg | ORAL_TABLET | Freq: Two times a day (BID) | ORAL | 0 refills | Status: AC

## 2019-07-26 NOTE — Progress Notes (Signed)
Wet mount reviewed and pt received treatment for BV per Hassell Done, FNP order and verbal order. Pt also requests appointment to come in for birth control, so FP prob visit scheduled for 08/04/2019 at 10:20am per pt request. Pt aware of appt date and time and to arrive early for check-in and appt card given to pt. Provider orders completed.Ronny Bacon, RN

## 2019-07-26 NOTE — Progress Notes (Signed)
Pt received flu vaccine today per pt request. Pt tolerated well.Eviana Sibilia, RN 

## 2019-07-26 NOTE — Progress Notes (Signed)
    STI clinic/screening visit  Subjective:  Miranda Hoffman is a 33 y.o. female being seen today for an STI screening visit. The patient reports they do not have symptoms.  Patient reports that they do not desire a pregnancy in the next year.   They reported they are not interested in discussing contraception today.   Patient has the following medical conditions:   Patient Active Problem List   Diagnosis Date Noted  . Severe dysplasia of cervix (CIN III) 08/24/2018  . LGSIL on Pap smear of cervix 07/09/2018  . History of pre-eclampsia 09/04/2016  . Depression, major, single episode, moderate ([place]) 08/05/2016  . Obesity, unspecified 05/18/2008     Chief Complaint  Patient presents with  . SEXUALLY TRANSMITTED DISEASE    HPI  Patient reports that she is her for an STD screen. She denies current symptoms but has had a discharge with odor couple weeks ago.  She has a h/o BV. She states that she was on Depo  For birth control but there has been a problem with [facility] giving her the shot because of a Medicaid coverage problem.  She would like to restart her Depo  See flowsheet for further details and programmatic requirements.    The following portions of the patient's history were reviewed and updated as appropriate: allergies, current medications, past medical history, past social history, past surgical history and problem list.  Objective:  There were no vitals filed for this visit.  Physical Exam HENT:     Mouth/Throat:     Mouth: Mucous membranes are moist.     Pharynx: Oropharynx is clear. No oropharyngeal exudate or posterior oropharyngeal erythema.  Neck:     Musculoskeletal: No muscular tenderness.  Abdominal:     Tenderness: There is no abdominal tenderness.  Genitourinary:    General: Normal vulva.     Labia:        Right: No rash, tenderness or lesion.        Left: No rash, tenderness or lesion.      Urethra: No urethral lesion.     Vagina: Vaginal  discharge present.     Comments: Small amount of white disch, ph 4.5, unable to detect an odor. Lymphadenopathy:     Cervical: No cervical adenopathy.     Lower Body: No right inguinal adenopathy. No left inguinal adenopathy.  Skin:    General: Skin is warm.     Findings: No erythema, lesion or rash.  Neurological:     Mental Status: She is alert.    Assessment and Plan:  Miranda Hoffman is a 33 y.o. female presenting to the [facility] Department for STI screening  1. Screening examination for venereal disease  - WET PREP FOR [place], YEAST, CLUE - Gonococcus culture - Chlamydia/Gonorrhea [place] [facility] - HIV [place] [facility] - Syphilis Serology, [place] [facility] Treat client for symptomatic BV with Metronidazole 500 mg po BID x 7 days. Co. Client that [facility] could provide her with family plannings services with or without Medicaid. Client states that she is interested in services. Encouraged her to use condoms for birth control and STD prevention.    No follow-ups on file.  No future appointments.  Hassell Done, FNP

## 2019-08-02 LAB — GONOCOCCUS CULTURE

## 2019-08-04 ENCOUNTER — Ambulatory Visit: Payer: Self-pay

## 2020-01-04 ENCOUNTER — Ambulatory Visit: Payer: Medicaid Other | Admitting: Obstetrics & Gynecology

## 2020-03-01 ENCOUNTER — Ambulatory Visit: Payer: Medicaid Other

## 2020-03-05 ENCOUNTER — Ambulatory Visit: Payer: Medicaid Other

## 2020-03-09 ENCOUNTER — Emergency Department
Admission: EM | Admit: 2020-03-09 | Discharge: 2020-03-09 | Disposition: A | Discharge status: Home / Self Care | Payer: Medicaid Other | Attending: Emergency Medicine | Admitting: Emergency Medicine

## 2020-03-09 ENCOUNTER — Other Ambulatory Visit: Payer: Self-pay

## 2020-03-09 DIAGNOSIS — N390 Urinary tract infection, site not specified: Secondary | ICD-10-CM

## 2020-03-09 DIAGNOSIS — N760 Acute vaginitis: Secondary | ICD-10-CM | POA: Insufficient documentation

## 2020-03-09 DIAGNOSIS — F17210 Nicotine dependence, cigarettes, uncomplicated: Secondary | ICD-10-CM | POA: Diagnosis not present

## 2020-03-09 DIAGNOSIS — B9689 Other specified bacterial agents as the cause of diseases classified elsewhere: Secondary | ICD-10-CM | POA: Diagnosis not present

## 2020-03-09 DIAGNOSIS — J45909 Unspecified asthma, uncomplicated: Secondary | ICD-10-CM | POA: Diagnosis not present

## 2020-03-09 DIAGNOSIS — R300 Dysuria: Secondary | ICD-10-CM | POA: Diagnosis present

## 2020-03-09 LAB — URINALYSIS, COMPLETE (UACMP) WITH MICROSCOPIC
Bilirubin Urine: NEGATIVE
Glucose, UA: NEGATIVE mg/dL
Ketones, ur: NEGATIVE mg/dL
Nitrite: NEGATIVE
Protein, ur: 30 mg/dL — AB
Specific Gravity, Urine: 1.027 (ref 1.005–1.030)
WBC, UA: >50 WBC/hpf — ABNORMAL HIGH (ref 0–5)
pH: 6 (ref 5.0–8.0)

## 2020-03-09 LAB — CHLAMYDIA/NGC RT PCR (ARMC ONLY)
Chlamydia Tr: NOT DETECTED
N gonorrhoeae: NOT DETECTED

## 2020-03-09 LAB — WET PREP, GENITAL
Sperm: NONE SEEN
Trich, Wet Prep: NONE SEEN
Yeast Wet Prep HPF POC: NONE SEEN

## 2020-03-09 LAB — PREGNANCY, URINE: Preg Test, Ur: NEGATIVE

## 2020-03-09 MED ORDER — SULFAMETHOXAZOLE-TRIMETHOPRIM 800-160 MG PO TABS
1.0000 | ORAL_TABLET | Freq: Once | ORAL | Status: AC
  Administered 2020-03-09: 1 via ORAL
  Filled 2020-03-09: qty 1

## 2020-03-09 MED ORDER — SULFAMETHOXAZOLE-TRIMETHOPRIM 800-160 MG PO TABS: 1.0000 | ORAL_TABLET | Freq: Two times a day (BID) | ORAL | 0 refills | Status: AC

## 2020-03-09 MED ORDER — METRONIDAZOLE 500 MG PO TABS: 500.0000 mg | ORAL_TABLET | Freq: Two times a day (BID) | ORAL | 0 refills | Status: AC

## 2020-03-09 MED ORDER — METRONIDAZOLE 500 MG PO TABS
500.0000 mg | ORAL_TABLET | Freq: Once | ORAL | Status: AC
  Administered 2020-03-09: 500 mg via ORAL
  Filled 2020-03-09: qty 1

## 2020-03-09 NOTE — ED Provider Notes (Signed)
[facility] Emergency Department Provider Note  ____________________________________________  Time seen: Approximately 8:45 PM  I have reviewed the triage vital signs and the nursing notes.   HISTORY  Chief Complaint Dysuria    HPI Miranda Hoffman is a 34 y.o. female that presents to the emergency department for evaluation of dysuria and urinary frequency for 1 day.  Patient states that the condom came off during intercourse 1 week ago.  She has had previous urinary tract infections but not for a while.  Patient denies any vaginal discharge.  She is on her menstrual cycle.  No fever, abdominal pain.  Past Medical History:  Diagnosis Date  . Anxiety   . Asthma    as a child-no inhalers  . Bell's palsy   . Complication of anesthesia    itching with epidural  . Headache    migraines  . History of pre-eclampsia 09/04/2016    Patient Active Problem List   Diagnosis Date Noted  . Severe dysplasia of cervix (CIN III) 08/24/2018  . LGSIL on Pap smear of cervix 07/09/2018  . Depression, major, single episode, moderate ([facility]) 08/05/2016  . Obesity, unspecified 05/18/2008    Past Surgical History:  Procedure Laterality Date  . CESAREAN SECTION    . INDUCED ABORTION    . LEEP N/A 08/24/2018   Procedure: LOOP ELECTROSURGICAL EXCISION PROCEDURE (LEEP);  Surgeon: Nadara Mustard, MD;  Location: [facility] ORS;  Service: Gynecology;  Laterality: N/A;    Prior to Admission medications   Medication Sig Start Date End Date Taking? Authorizing Provider  acetaminophen (TYLENOL) 500 MG tablet Take 1,000 mg by mouth every 6 (six) hours as needed for moderate pain or headache.    [provider]  citalopram (CELEXA) 20 MG tablet Take 1 tablet (20 mg total) by mouth daily. Patient not taking: Reported on 07/26/2019 06/15/18   Nadara Mustard, MD  ibuprofen (ADVIL,MOTRIN) 200 MG tablet Take 600 mg by mouth every 6 (six) hours as needed for moderate pain.     [provider]  ibuprofen (ADVIL,MOTRIN) 600 MG tablet Take 1 tablet (600 mg total) by mouth every 6 (six) hours as needed for mild pain, moderate pain or cramping. 08/24/18   Nadara Mustard, MD  medroxyPROGESTERone (DEPO-PROVERA) 150 MG/ML injection Inject 1 mL (150 mg total) into the muscle every 3 (three) months. 06/15/18   Nadara Mustard, MD  metroNIDAZOLE (FLAGYL) 500 MG tablet Take 1 tablet (500 mg total) by mouth 2 (two) times daily for 7 days. 03/09/20 03/16/20  Enid Derry, PA-C  oxyCODONE-acetaminophen (PERCOCET/ROXICET) 5-325 MG tablet Take 1-2 tablets by mouth every 3 (three) hours as needed for moderate pain. Patient not taking: Reported on 09/09/2018 08/24/18   Nadara Mustard, MD  sulfamethoxazole-trimethoprim (BACTRIM DS) 800-160 MG tablet Take 1 tablet by mouth 2 (two) times daily for 10 days. 03/09/20 03/19/20  Enid Derry, PA-C    Allergies Penicillins  History reviewed. No pertinent family history.  Social History Social History   Tobacco Use  . Smoking status: Current Some Day Smoker    Years: 1.00    Types: Cigarettes  . Smokeless tobacco: Never Used  Vaping Use  . Vaping Use: Never used  Substance Use Topics  . Alcohol use: Yes    Comment: occ wine  . Drug use: Never     Review of Systems  Cardiovascular: No chest pain. Respiratory: No SOB. Gastrointestinal: No abdominal pain.  No nausea, no vomiting.  Genitourinary: Positive for  dysuria. Musculoskeletal: Negative for musculoskeletal pain. Skin: Negative for rash, abrasions, lacerations, ecchymosis. Neurological: Negative for headaches   ____________________________________________   PHYSICAL EXAM:  VITAL SIGNS: ED Triage Vitals  Enc Vitals Group     BP 03/09/20 1819 126/82     Pulse Rate 03/09/20 1819 84     Resp 03/09/20 1819 18     Temp 03/09/20 1819 98.8 F (37.1 C)     Temp Source 03/09/20 1819 Oral     SpO2 03/09/20 1819 99 %     Weight 03/09/20 1820 255 lb (115.7  kg)     Height 03/09/20 1820 5\' 8"  (1.727 m)     Head Circumference --      Peak Flow --      Pain Score 03/09/20 1819 5     Pain Loc --      Pain Edu? --      Excl. in GC? --      Constitutional: Alert and oriented. Well appearing and in no acute distress. Eyes: Conjunctivae are normal. PERRL. EOMI. Head: Atraumatic. ENT:      Ears:      Nose: No congestion/rhinnorhea.      Mouth/Throat: Mucous membranes are moist.  Neck: No stridor.  Cardiovascular: Normal rate, regular rhythm.  Good peripheral circulation. Respiratory: Normal respiratory effort without tachypnea or retractions. Lungs CTAB. Good air entry to the bases with no decreased or absent breath sounds. Gastrointestinal: Bowel sounds 4 quadrants. Soft and nontender to palpation. No guarding or rigidity. No palpable masses. No distention. No CVA tenderness. Genitourinary: No external rashes or lesions.  Minimal blood in vaginal canal.  No cervical motion tenderness. Musculoskeletal: Full range of motion to all extremities. No gross deformities appreciated. Neurologic:  Normal speech and language. No gross focal neurologic deficits are appreciated.  Skin:  Skin is warm, dry and intact. No rash noted. Psychiatric: Mood and affect are normal. Speech and behavior are normal. Patient exhibits appropriate insight and judgement.   ____________________________________________   LABS (all labs ordered are listed, but only abnormal results are displayed)  Labs Reviewed  WET PREP, GENITAL - Abnormal; Notable for the following components:      Result Value   Clue Cells Wet Prep HPF POC PRESENT (*)    WBC, Wet Prep HPF POC FEW (*)    All other components within normal limits  URINALYSIS, COMPLETE (UACMP) WITH MICROSCOPIC - Abnormal; Notable for the following components:   Color, Urine YELLOW (*)    APPearance HAZY (*)    Hgb urine dipstick SMALL (*)    Protein, ur 30 (*)    Leukocytes,Ua MODERATE (*)    WBC, UA >50 (*)     Bacteria, UA RARE (*)    All other components within normal limits  CHLAMYDIA/NGC RT PCR ([facility] ONLY)  URINE CULTURE  PREGNANCY, URINE   ____________________________________________  EKG   ____________________________________________  RADIOLOGY   No results found.  ____________________________________________    PROCEDURES  Procedure(s) performed:    Procedures    Medications  sulfamethoxazole-trimethoprim (BACTRIM DS) 800-160 MG per tablet 1 tablet (has no administration in time range)  metroNIDAZOLE (FLAGYL) tablet 500 mg (has no administration in time range)     ____________________________________________   INITIAL IMPRESSION / ASSESSMENT AND PLAN / ED COURSE  Pertinent labs & imaging results that were available during my care of the patient were reviewed by me and considered in my medical decision making (see chart for details).  Review of the [place] CSRS  was performed in accordance of the [facility] prior to dispensing any controlled drugs.  Patient presents to the emergency department for evaluation of dysuria and urgency for 1 day.  Vital signs and exam are reassuring.  Urinalysis is contributory for infection.  Patient was given a dose of Bactrim for infection.  Patient does have some hemoglobin and blood in her urine but she is on her menstrual cycle.  Pregnancy test is negative.  Wet prep shows clue cells and patient was treated for bacterial vaginosis with Flagyl.  Gonorrhea and Chlamydia tests are pending.  Patient will look up her results on MyChart tomorrow.  Patient will be discharged home with prescriptions for Bactrim and Flagyl. Patient is to follow up with primary care as directed. Patient is given ED precautions to return to the ED for any worsening or new symptoms.   Miranda Hoffman was evaluated in Emergency Department on 03/09/2020 for the symptoms described in the history of present illness. She was evaluated in the context of the global  COVID-19 pandemic, which necessitated consideration that the patient might be at risk for infection with the SARS-CoV-2 virus that causes COVID-19. Institutional protocols and algorithms that pertain to the evaluation of patients at risk for COVID-19 are in a state of rapid change based on information released by regulatory bodies including the CDC and federal and state organizations. These policies and algorithms were followed during the patient's care in the ED.   ____________________________________________  FINAL CLINICAL IMPRESSION(S) / ED DIAGNOSES  Final diagnoses:  Lower urinary tract infectious disease  BV (bacterial vaginosis)      NEW MEDICATIONS STARTED DURING THIS VISIT:  ED Discharge Orders         Ordered    metroNIDAZOLE (FLAGYL) 500 MG tablet  2 times daily     Discontinue  Reprint     03/09/20 2241    sulfamethoxazole-trimethoprim (BACTRIM DS) 800-160 MG tablet  2 times daily     Discontinue  Reprint     03/09/20 2241              This chart was dictated using voice recognition software/Dragon. Despite best efforts to proofread, errors can occur which can change the meaning. Any change was purely unintentional.    Enid Derry, PA-C 03/09/20 2259    Emily Filbert, MD 03/09/20 [phone]

## 2020-03-09 NOTE — ED Triage Notes (Signed)
Pt arrives via POV for c/o dysuria and urinary frequency since yesterday. Pt reports she was having intercourse and the condom came off during sex. Pt states she has gotten her cycle but now has burning with urination. Denies vaginal discharge. Pt ambulatory from lobby in NAD, skin warm and dry

## 2020-03-12 LAB — URINE CULTURE: Culture: >=100000 — AB

## 2022-02-11 ENCOUNTER — Emergency Department
Admission: EM | Admit: 2022-02-11 | Discharge: 2022-02-11 | Disposition: A | Discharge status: Home / Self Care | Payer: BC Managed Care – PPO | Attending: Emergency Medicine | Admitting: Emergency Medicine

## 2022-02-11 ENCOUNTER — Encounter: Payer: Self-pay | Admitting: Emergency Medicine

## 2022-02-11 ENCOUNTER — Other Ambulatory Visit: Payer: Self-pay

## 2022-02-11 DIAGNOSIS — N3000 Acute cystitis without hematuria: Secondary | ICD-10-CM | POA: Insufficient documentation

## 2022-02-11 DIAGNOSIS — R1030 Lower abdominal pain, unspecified: Secondary | ICD-10-CM | POA: Diagnosis present

## 2022-02-11 MED ORDER — CEPHALEXIN 500 MG PO CAPS: 500.0000 mg | ORAL_CAPSULE | Freq: Two times a day (BID) | ORAL | 0 refills | Status: AC

## 2022-02-11 NOTE — ED Provider Notes (Signed)
   [facility] Provider Note    Event Date/Time   First MD Initiated Contact with Patient 02/11/22 1425     (approximate)   History   Urinary Frequency   HPI  Miranda Hoffman is a 36 y.o. female who reports lower central abdominal discomfort and urinary frequency over the last day.  She thinks that she may have a urinary tract infection.  No vaginal bleeding or discharge.  No fevers or back pain     Physical Exam   Triage Vital Signs: ED Triage Vitals  Enc Vitals Group     BP 02/11/22 1442 (!) 135/94     Pulse Rate 02/11/22 1442 77     Resp 02/11/22 1442 20     Temp 02/11/22 1442 98.1 F (36.7 C)     Temp src --      SpO2 02/11/22 1442 98 %     Weight 02/11/22 1430 105.2 kg (232 lb)     Height 02/11/22 1430 1.702 m (5\' 7" )     Head Circumference --      Peak Flow --      Pain Score 02/11/22 1430 0     Pain Loc --      Pain Edu? --      Excl. in GC? --     Most recent vital signs: Vitals:   02/11/22 1442  BP: (!) 135/94  Pulse: 77  Resp: 20  Temp: 98.1 F (36.7 C)  SpO2: 98%     General: Awake, no distress.  CV:  Good peripheral perfusion.  Resp:  Normal effort.  Abd:  No distention.  Other:     ED Results / Procedures / Treatments   Labs (all labs ordered are listed, but only abnormal results are displayed) Labs Reviewed - No data to display   EKG     RADIOLOGY     PROCEDURES:  Critical Care performed:   Procedures   MEDICATIONS ORDERED IN ED: Medications - No data to display   IMPRESSION / MDM / ASSESSMENT AND PLAN / ED COURSE  I reviewed the triage vital signs and the nursing notes. Patient's presentation is most consistent with acute, uncomplicated illness.  Patient presents with lower abdominal pain, urinary frequency  Differential includes UTI, pyelonephritis, cervicitis  No reports of discharge, no vaginal bleeding, no back pain  Clinically consistent with UTI, will treat with  antibiotics, outpatient follow-up, return precautions discussed        FINAL CLINICAL IMPRESSION(S) / ED DIAGNOSES   Final diagnoses:  Acute cystitis without hematuria     Rx / DC Orders   ED Discharge Orders          Ordered    cephALEXin (KEFLEX) 500 MG capsule  2 times daily        02/11/22 1426             Note:  This document was prepared using Dragon voice recognition software and may include unintentional dictation errors.   02/13/22, MD 02/11/22 1534

## 2022-02-11 NOTE — ED Triage Notes (Signed)
Pt via POV from home. Pt c/o urinary urgency for the past 3 days. Also, c/o back pain. Pt is A&Ox4 and NAD

## 2022-09-09 ENCOUNTER — Emergency Department: Payer: No Typology Code available for payment source

## 2022-09-09 ENCOUNTER — Encounter: Payer: Self-pay | Admitting: Emergency Medicine

## 2022-09-09 ENCOUNTER — Emergency Department
Admission: EM | Admit: 2022-09-09 | Discharge: 2022-09-09 | Disposition: A | Discharge status: Home / Self Care | Payer: No Typology Code available for payment source | Attending: Emergency Medicine | Admitting: Emergency Medicine

## 2022-09-09 ENCOUNTER — Other Ambulatory Visit: Payer: Self-pay

## 2022-09-09 DIAGNOSIS — R1084 Generalized abdominal pain: Secondary | ICD-10-CM

## 2022-09-09 DIAGNOSIS — R109 Unspecified abdominal pain: Secondary | ICD-10-CM | POA: Diagnosis present

## 2022-09-09 DIAGNOSIS — K5900 Constipation, unspecified: Secondary | ICD-10-CM | POA: Insufficient documentation

## 2022-09-09 LAB — URINALYSIS, ROUTINE W REFLEX MICROSCOPIC
Bilirubin Urine: NEGATIVE
Glucose, UA: NEGATIVE mg/dL
Hgb urine dipstick: NEGATIVE
Ketones, ur: NEGATIVE mg/dL
Leukocytes,Ua: NEGATIVE
Nitrite: NEGATIVE
Protein, ur: NEGATIVE mg/dL
Specific Gravity, Urine: 1.023 (ref 1.005–1.030)
pH: 6 (ref 5.0–8.0)

## 2022-09-09 LAB — CBC
HCT: 38.2 % (ref 36.0–46.0)
Hemoglobin: 11.7 g/dL — ABNORMAL LOW (ref 12.0–15.0)
MCH: 25.7 pg — ABNORMAL LOW (ref 26.0–34.0)
MCHC: 30.6 g/dL (ref 30.0–36.0)
MCV: 83.8 fL (ref 80.0–100.0)
Platelets: 373 10*3/uL (ref 150–400)
RBC: 4.56 MIL/uL (ref 3.87–5.11)
RDW: 14.6 % (ref 11.5–15.5)
WBC: 4.9 10*3/uL (ref 4.0–10.5)
nRBC: 0 % (ref 0.0–0.2)

## 2022-09-09 LAB — COMPREHENSIVE METABOLIC PANEL
ALT: 9 U/L (ref 0–44)
AST: 21 U/L (ref 15–41)
Albumin: 3.9 g/dL (ref 3.5–5.0)
Alkaline Phosphatase: 73 U/L (ref 38–126)
Anion gap: 8 (ref 5–15)
BUN: 10 mg/dL (ref 6–20)
CO2: 22 mmol/L (ref 22–32)
Calcium: 9.1 mg/dL (ref 8.9–10.3)
Chloride: 107 mmol/L (ref 98–111)
Creatinine, Ser: 0.7 mg/dL (ref 0.44–1.00)
GFR, Estimated: >60 mL/min (ref >60)
Glucose, Bld: 117 mg/dL — ABNORMAL HIGH (ref 70–99)
Potassium: 3.3 mmol/L — ABNORMAL LOW (ref 3.5–5.1)
Sodium: 137 mmol/L (ref 135–145)
Total Bilirubin: 0.7 mg/dL (ref 0.3–1.2)
Total Protein: 8 g/dL (ref 6.5–8.1)

## 2022-09-09 LAB — POC URINE PREG, ED: Preg Test, Ur: NEGATIVE

## 2022-09-09 LAB — LIPASE, BLOOD: Lipase: 29 U/L (ref 11–51)

## 2022-09-09 MED ORDER — POLYETHYLENE GLYCOL 3350 17 G PO PACK: 17.0000 g | PACK | Freq: Every day | ORAL | 0 refills | Status: AC

## 2022-09-09 MED ORDER — METAMUCIL SMOOTH TEXTURE 58.6 % PO POWD: 1.0000 | Freq: Three times a day (TID) | ORAL | 12 refills | Status: AC

## 2022-09-09 NOTE — ED Triage Notes (Signed)
Pt presents to the ED via POV due to abdominal pain that's been going on for awhile. Pt states she just noticed it more this past week. Pt states she is also constipated and pain radiating to her groin. Pt A&Ox4

## 2022-09-09 NOTE — Discharge Instructions (Addendum)
Thank you for choosing [place] for your health care today!  Take constipation medications as prescribed.  Please see your primary doctor this week for a follow up appointment.   Sometimes, in the early stages of certain disease courses it is difficult to detect in the emergency department evaluation -- so, it is important that you continue to monitor your symptoms and call your doctor right away or return to the emergency department if you develop any new or worsening symptoms.  Please go to the following website to schedule new (and existing) patient appointments:   [email or web address]  If you do not have a primary doctor try calling the following clinics to establish care:  If you have insurance:  [facility] [id number] [place] [address]   [facility]  [phone] [place] [address]   If you do not have insurance:  [facility]  [phone] [address], [place] [address]   The following is another list of primary care offices in the area who are accepting new patients at this time.  Please reach out to one of them directly and let them know you would like to schedule an appointment to follow up on an Emergency Department visit, and/or to establish a new primary care provider (PCP).  There are likely other primary care clinics in the are who are accepting new patients, but this is an excellent place to start:  [place] physician: Dr Lavon Paganini [address] [place], [place] [address] [phone]  [facility] Lead Physician: Dr Steele Sizer [address], [place], [place] [address] [phone]  [place] Physician: Dr Park Liter [address] [place], [place] [address] [phone]  [facility] Lead Physician: Dr Dewaine Oats [place], [place], [place]  [address] [phone]  [facility] [place] Physician: Dr Halina Maidens [address] [place], [place] [address] [phone]   It was my pleasure to care for you today.   Hoover Brunette Jacelyn Grip, MD

## 2022-09-09 NOTE — ED Provider Triage Note (Signed)
Emergency Medicine Provider Triage Evaluation Note  Miranda Hoffman , a 37 y.o. female  was evaluated in triage.  Pt complains of abd pain off and on for a couple months with a 'constipated' feeling.  Review of Systems  Positive: Abd pain Negative: Does not think she is pregnant. No vomiting  Physical Exam  BP (!) 151/97 (BP Location: Left Arm)   Pulse 78   Temp 98.9 F (37.2 C) (Oral)   Resp 16   Ht 5\' 7"  (1.702 m)   Wt 124.7 kg   SpO2 97%   BMI 43.07 kg/m  Gen:   Awake, no distress   Resp:  Normal effort  MSK:   Moves extremities without difficulty  Other:    Medical Decision Making  Medically screening exam initiated at 10:26 AM.  Appropriate orders placed.  CALAH GERSHMAN was informed that the remainder of the evaluation will be completed by another provider, this initial triage assessment does not replace that evaluation, and the importance of remaining in the ED until their evaluation is complete.  Check preg test, obtain 2 view abdomen, LFTs, lipase   Delman Kitten, MD 09/09/22 1027

## 2022-09-09 NOTE — ED Provider Notes (Addendum)
[facility] Provider Note    Event Date/Time   First MD Initiated Contact with Patient 09/09/22 1055     (approximate)   History   Abdominal Pain   HPI  Miranda Hoffman is a 37 y.o. female   Past medical history of no significant past medical history presents to the emergency department with bloating abdominal discomfort over the last 1 to 2 months.  Pain comes and goes.  Constipated and pushing hard for stools.  No bleeding.  She has had no vaginal discharge, nausea, vomiting.  No fever or chills.  Denies dysuria or frequency but states some discomfort when urinating, needing to push.   She thinks that her diet may be contributing as she drinks a lot of soda, sugary beverages and fried foods.  Low fiber.  History was obtained via the patient. I reviewed external medical notes including a visit dated June 2023 to the emergency department diagnosed with urinary tract infection.     Physical Exam   Triage Vital Signs: ED Triage Vitals  Enc Vitals Group     BP 09/09/22 1022 (!) 151/97     Pulse Rate 09/09/22 1022 78     Resp 09/09/22 1022 16     Temp 09/09/22 1022 98.9 F (37.2 C)     Temp Source 09/09/22 1022 Oral     SpO2 09/09/22 1022 97 %     Weight 09/09/22 1022 275 lb (124.7 kg)     Height 09/09/22 1022 5\' 7"  (1.702 m)     Head Circumference --      Peak Flow --      Pain Score 09/09/22 1024 7     Pain Loc --      Pain Edu? --      Excl. in [place]? --     Most recent vital signs: Vitals:   09/09/22 1022  BP: (!) 151/97  Pulse: 78  Resp: 16  Temp: 98.9 F (37.2 C)  SpO2: 97%    General: Awake, no distress.  CV:  Good peripheral perfusion.  Resp:  Normal effort.  Abd:  No distention.  Other:  Soft nontender abdomen to all quadrants, no rigidity or guarding.   ED Results / Procedures / Treatments   Labs (all labs ordered are listed, but only abnormal results are displayed) Labs Reviewed  CBC - Abnormal; Notable for the  following components:      Result Value   Hemoglobin 11.7 (*)    MCH 25.7 (*)    All other components within normal limits  COMPREHENSIVE METABOLIC PANEL - Abnormal; Notable for the following components:   Potassium 3.3 (*)    Glucose, Bld 117 (*)    All other components within normal limits  URINALYSIS, ROUTINE W REFLEX MICROSCOPIC - Abnormal; Notable for the following components:   Color, Urine YELLOW (*)    APPearance CLEAR (*)    All other components within normal limits  LIPASE, BLOOD  POC URINE PREG, ED     I reviewed labs and they are notable for normal white blood cell count of 4.9.  Creatinine is 0.7.   I personally independently reviewed x-ray of the abdomen and see a large stool burden on the right side, no free air.  PROCEDURES:  Critical Care performed: No  Procedures   MEDICATIONS ORDERED IN ED: Medications - No data to display   IMPRESSION / MDM / [place] / ED COURSE  I reviewed the triage vital  signs and the nursing notes.                              Differential diagnosis includes, but is not limited to, urinary tract infection, constipation, considered but less likely intra-abdominal emergencies like obstruction, infection   MDM: This is a healthy young patient with abdominal bloating discomfort and constipation over the last approximately 2 months.  Soft benign abdominal exam as above, defer CT imaging given risks benefits discussed with patient and very low suspicion of surgical abdominal pathologies at this time.  Will check a urinalysis for UTI given discomfort with urination.  Will prescribe bowel regimen for constipation and dietary changes including higher fiber.  Since clinical suspicion for surgical abdomen at this time is very low, I considered observation admission for serial abdominal exams but instead will do a staged discharge approach whereby the patient will use bowel regimen and follow-up with PMD and will closely monitor  symptoms for any changes or worsening return to the emergency department immediately if any signs or symptoms progression..   Patient's presentation is most consistent with acute presentation with potential threat to life or bodily function.       FINAL CLINICAL IMPRESSION(S) / ED DIAGNOSES   Final diagnoses:  Generalized abdominal pain  Constipation, unspecified constipation type     Rx / DC Orders   ED Discharge Orders          Ordered    psyllium (METAMUCIL SMOOTH TEXTURE) 58.6 % powder  3 times daily        09/09/22 1146    polyethylene glycol (MIRALAX) 17 g packet  Daily        09/09/22 1146             Note:  This document was prepared using Dragon voice recognition software and may include unintentional dictation errors.    Lucillie Garfinkel, MD 09/09/22 1146    Lucillie Garfinkel, MD 09/09/22 1158    Lucillie Garfinkel, MD 09/09/22 1300

## 2024-01-19 DIAGNOSIS — N809 Endometriosis, unspecified: Secondary | ICD-10-CM | POA: Diagnosis not present

## 2024-02-19 DIAGNOSIS — R059 Cough, unspecified: Secondary | ICD-10-CM | POA: Diagnosis not present

## 2024-02-19 DIAGNOSIS — Z758 Other problems related to medical facilities and other health care: Secondary | ICD-10-CM | POA: Diagnosis not present

## 2024-02-19 DIAGNOSIS — R053 Chronic cough: Secondary | ICD-10-CM | POA: Diagnosis not present

## 2024-02-19 DIAGNOSIS — Z880 Allergy status to penicillin: Secondary | ICD-10-CM | POA: Diagnosis not present
